# Patient Record
Sex: Male | Born: 1965 | Race: White | Hispanic: No | Smoking: Never smoker
Health system: Southern US, Community
[De-identification: ages and names within clinical notes are randomized; demographics above are authoritative.]

## PROBLEM LIST (undated history)

## (undated) DIAGNOSIS — I10 Essential (primary) hypertension: Secondary | ICD-10-CM

## (undated) DIAGNOSIS — M1990 Unspecified osteoarthritis, unspecified site: Secondary | ICD-10-CM

## (undated) DIAGNOSIS — J329 Chronic sinusitis, unspecified: Secondary | ICD-10-CM

## (undated) DIAGNOSIS — E785 Hyperlipidemia, unspecified: Secondary | ICD-10-CM

## (undated) DIAGNOSIS — E0590 Thyrotoxicosis, unspecified without thyrotoxic crisis or storm: Secondary | ICD-10-CM

## (undated) DIAGNOSIS — L719 Rosacea, unspecified: Secondary | ICD-10-CM

## (undated) DIAGNOSIS — Z91013 Allergy to seafood: Secondary | ICD-10-CM

## (undated) HISTORY — PX: COLONOSCOPY: SHX174

## (undated) HISTORY — DX: Unspecified osteoarthritis, unspecified site: M19.90

## (undated) HISTORY — DX: Thyrotoxicosis, unspecified without thyrotoxic crisis or storm: E05.90

## (undated) HISTORY — DX: Chronic sinusitis, unspecified: J32.9

## (undated) HISTORY — DX: Allergy to seafood: Z91.013

## (undated) HISTORY — PX: OTHER SURGICAL HISTORY: SHX169

## (undated) HISTORY — DX: Hyperlipidemia, unspecified: E78.5

## (undated) HISTORY — DX: Essential (primary) hypertension: I10

## (undated) HISTORY — DX: Rosacea, unspecified: L71.9

---

## 2000-02-13 ENCOUNTER — Encounter: Payer: Self-pay | Admitting: Emergency Medicine

## 2000-02-13 ENCOUNTER — Emergency Department (HOSPITAL_COMMUNITY): Admission: EM | Admit: 2000-02-13 | Discharge: 2000-02-13 | Payer: Self-pay | Admitting: Emergency Medicine

## 2004-12-05 ENCOUNTER — Ambulatory Visit: Payer: Self-pay | Admitting: Pulmonary Disease

## 2005-06-03 ENCOUNTER — Ambulatory Visit: Payer: Self-pay | Admitting: Pulmonary Disease

## 2005-06-18 ENCOUNTER — Ambulatory Visit: Payer: Self-pay | Admitting: Pulmonary Disease

## 2006-04-24 ENCOUNTER — Ambulatory Visit: Payer: Self-pay | Admitting: Pulmonary Disease

## 2006-05-26 ENCOUNTER — Ambulatory Visit: Payer: Self-pay | Admitting: Internal Medicine

## 2007-06-08 ENCOUNTER — Ambulatory Visit: Payer: Self-pay | Admitting: Pulmonary Disease

## 2007-06-08 LAB — CONVERTED CEMR LAB
ALT: 18 units/L (ref 0–53)
AST: 24 units/L (ref 0–37)
Albumin: 4 g/dL (ref 3.5–5.2)
Alkaline Phosphatase: 97 units/L (ref 39–117)
BUN: 14 mg/dL (ref 6–23)
Basophils Absolute: 0 10*3/uL (ref 0.0–0.1)
Basophils Relative: 0.5 % (ref 0.0–1.0)
Bilirubin Urine: NEGATIVE
Bilirubin, Direct: 0.1 mg/dL (ref 0.0–0.3)
CO2: 27 meq/L (ref 19–32)
Calcium: 9.2 mg/dL (ref 8.4–10.5)
Chloride: 112 meq/L (ref 96–112)
Cholesterol: 204 mg/dL (ref 0–200)
Creatinine, Ser: 0.9 mg/dL (ref 0.4–1.5)
Direct LDL: 120 mg/dL
Eosinophils Absolute: 0.5 10*3/uL (ref 0.0–0.6)
Eosinophils Relative: 8.9 % — ABNORMAL HIGH (ref 0.0–5.0)
GFR calc Af Amer: 120 mL/min
GFR calc non Af Amer: 99 mL/min
Glucose, Bld: 111 mg/dL — ABNORMAL HIGH (ref 70–99)
HCT: 43.4 % (ref 39.0–52.0)
HDL: 69.7 mg/dL (ref >39.0)
Hemoglobin, Urine: NEGATIVE
Hemoglobin: 15.1 g/dL (ref 13.0–17.0)
Ketones, ur: NEGATIVE mg/dL
Leukocytes, UA: NEGATIVE
Lymphocytes Relative: 29.4 % (ref 12.0–46.0)
MCHC: 34.9 g/dL (ref 30.0–36.0)
MCV: 98 fL (ref 78.0–100.0)
Monocytes Absolute: 0.5 10*3/uL (ref 0.2–0.7)
Monocytes Relative: 8.5 % (ref 3.0–11.0)
Neutro Abs: 3.3 10*3/uL (ref 1.4–7.7)
Neutrophils Relative %: 52.7 % (ref 43.0–77.0)
Nitrite: NEGATIVE
Platelets: 322 10*3/uL (ref 150–400)
Potassium: 4.4 meq/L (ref 3.5–5.1)
RBC: 4.43 M/uL (ref 4.22–5.81)
RDW: 11.6 % (ref 11.5–14.6)
Sodium: 140 meq/L (ref 135–145)
Specific Gravity, Urine: 1.015 (ref 1.000–1.03)
TSH: 0.02 microintl units/mL — ABNORMAL LOW (ref 0.35–5.50)
Total Bilirubin: 0.8 mg/dL (ref 0.3–1.2)
Total CHOL/HDL Ratio: 2.9
Total Protein, Urine: NEGATIVE mg/dL
Total Protein: 7.3 g/dL (ref 6.0–8.3)
Triglycerides: 71 mg/dL (ref 0–149)
Urine Glucose: NEGATIVE mg/dL
Urobilinogen, UA: 0.2 (ref 0.0–1.0)
VLDL: 14 mg/dL (ref 0–40)
WBC: 6.1 10*3/uL (ref 4.5–10.5)
pH: 6 (ref 5.0–8.0)

## 2007-06-29 ENCOUNTER — Ambulatory Visit: Payer: Self-pay | Admitting: Pulmonary Disease

## 2007-06-29 LAB — CONVERTED CEMR LAB
Free T4: 0.7 ng/dL (ref 0.6–1.6)
T3, Free: 2.7 pg/mL (ref 2.3–4.2)
TSH: 0.98 microintl units/mL (ref 0.35–5.50)

## 2007-12-17 DIAGNOSIS — E785 Hyperlipidemia, unspecified: Secondary | ICD-10-CM | POA: Insufficient documentation

## 2007-12-18 ENCOUNTER — Ambulatory Visit: Payer: Self-pay | Admitting: Pulmonary Disease

## 2007-12-18 DIAGNOSIS — F1010 Alcohol abuse, uncomplicated: Secondary | ICD-10-CM | POA: Insufficient documentation

## 2007-12-18 DIAGNOSIS — Z91013 Allergy to seafood: Secondary | ICD-10-CM | POA: Insufficient documentation

## 2007-12-18 DIAGNOSIS — K649 Unspecified hemorrhoids: Secondary | ICD-10-CM | POA: Insufficient documentation

## 2007-12-18 DIAGNOSIS — J329 Chronic sinusitis, unspecified: Secondary | ICD-10-CM | POA: Insufficient documentation

## 2007-12-23 ENCOUNTER — Encounter: Payer: Self-pay | Admitting: Pulmonary Disease

## 2008-01-12 ENCOUNTER — Ambulatory Visit (HOSPITAL_BASED_OUTPATIENT_CLINIC_OR_DEPARTMENT_OTHER): Admission: RE | Admit: 2008-01-12 | Discharge: 2008-01-12 | Payer: Self-pay | Admitting: Orthopedic Surgery

## 2008-01-12 ENCOUNTER — Encounter (INDEPENDENT_AMBULATORY_CARE_PROVIDER_SITE_OTHER): Payer: Self-pay | Admitting: Orthopedic Surgery

## 2008-01-25 ENCOUNTER — Encounter: Payer: Self-pay | Admitting: Pulmonary Disease

## 2008-07-24 IMAGING — CR DG CHEST 2V
2 series · 2 of 2 positions shown · non-contrast
Comparison: 06/03/05.

CLINICAL DATA: Physical.  
 CHEST - 2 VIEW:

[view not recorded (1 of 2)]
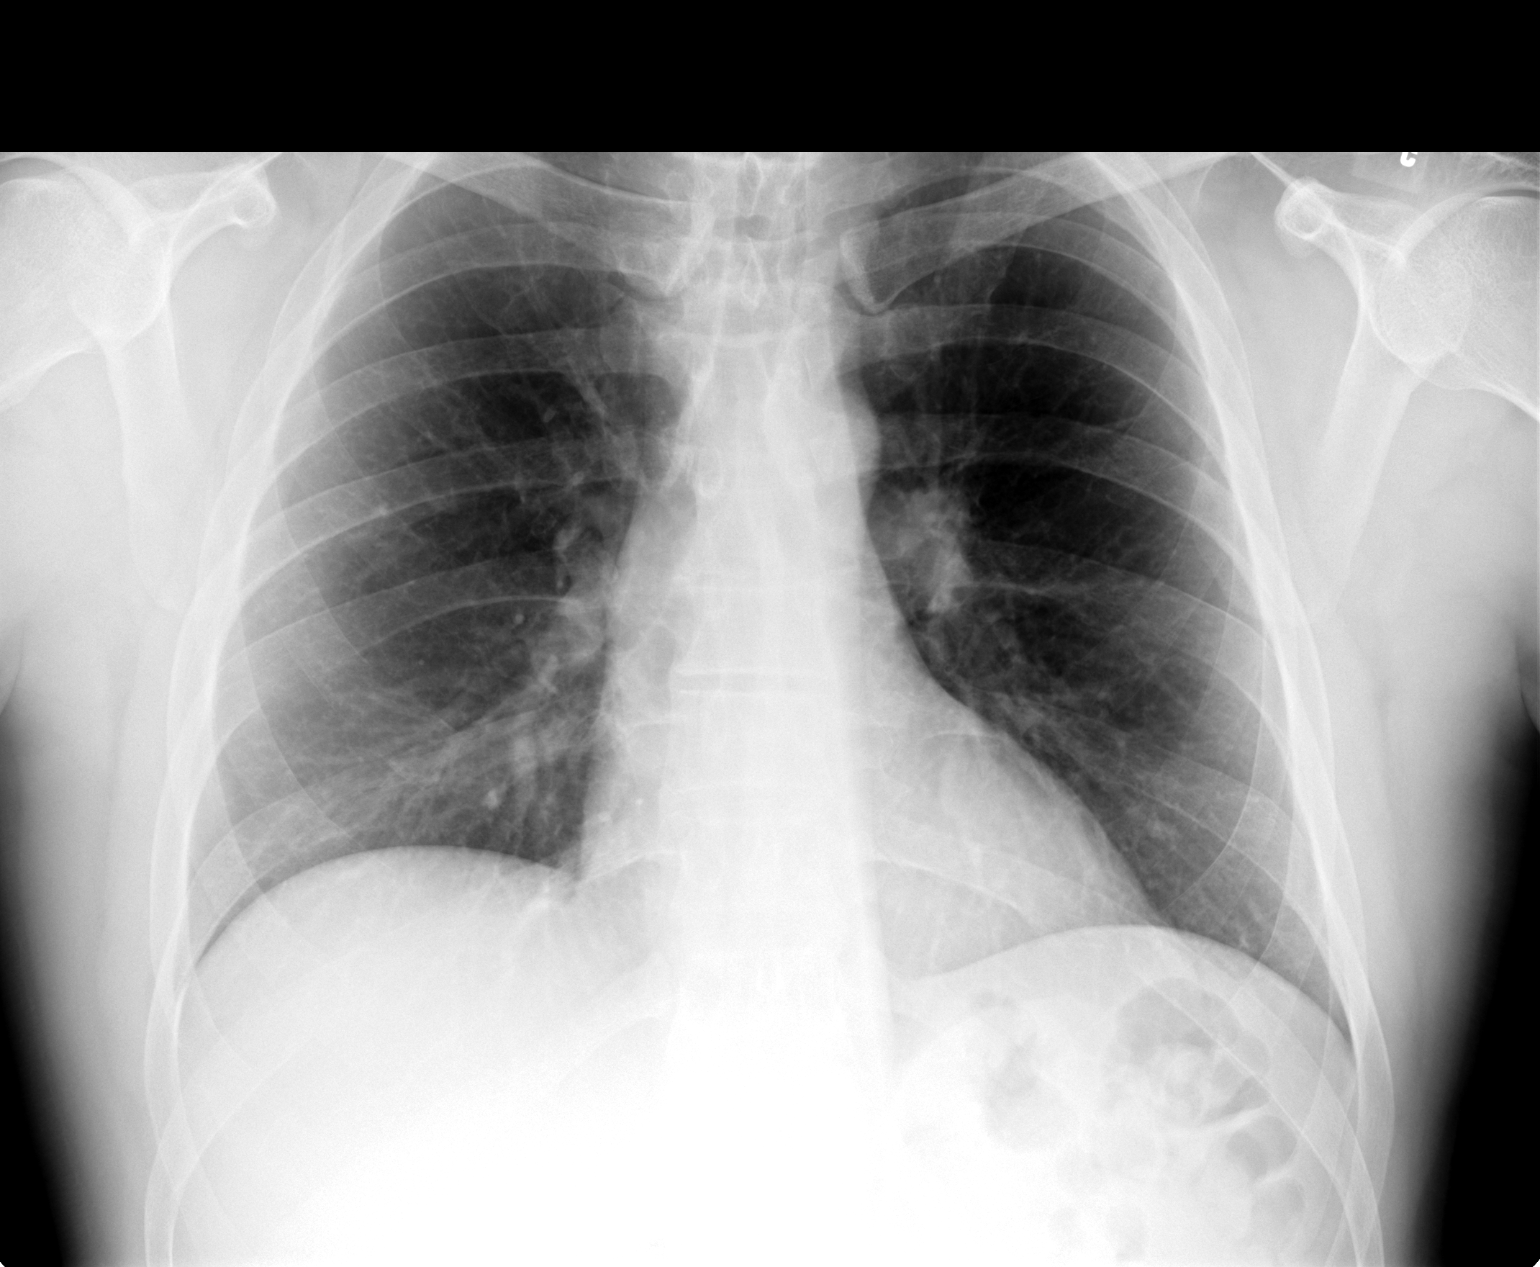

[view not recorded (2 of 2)]
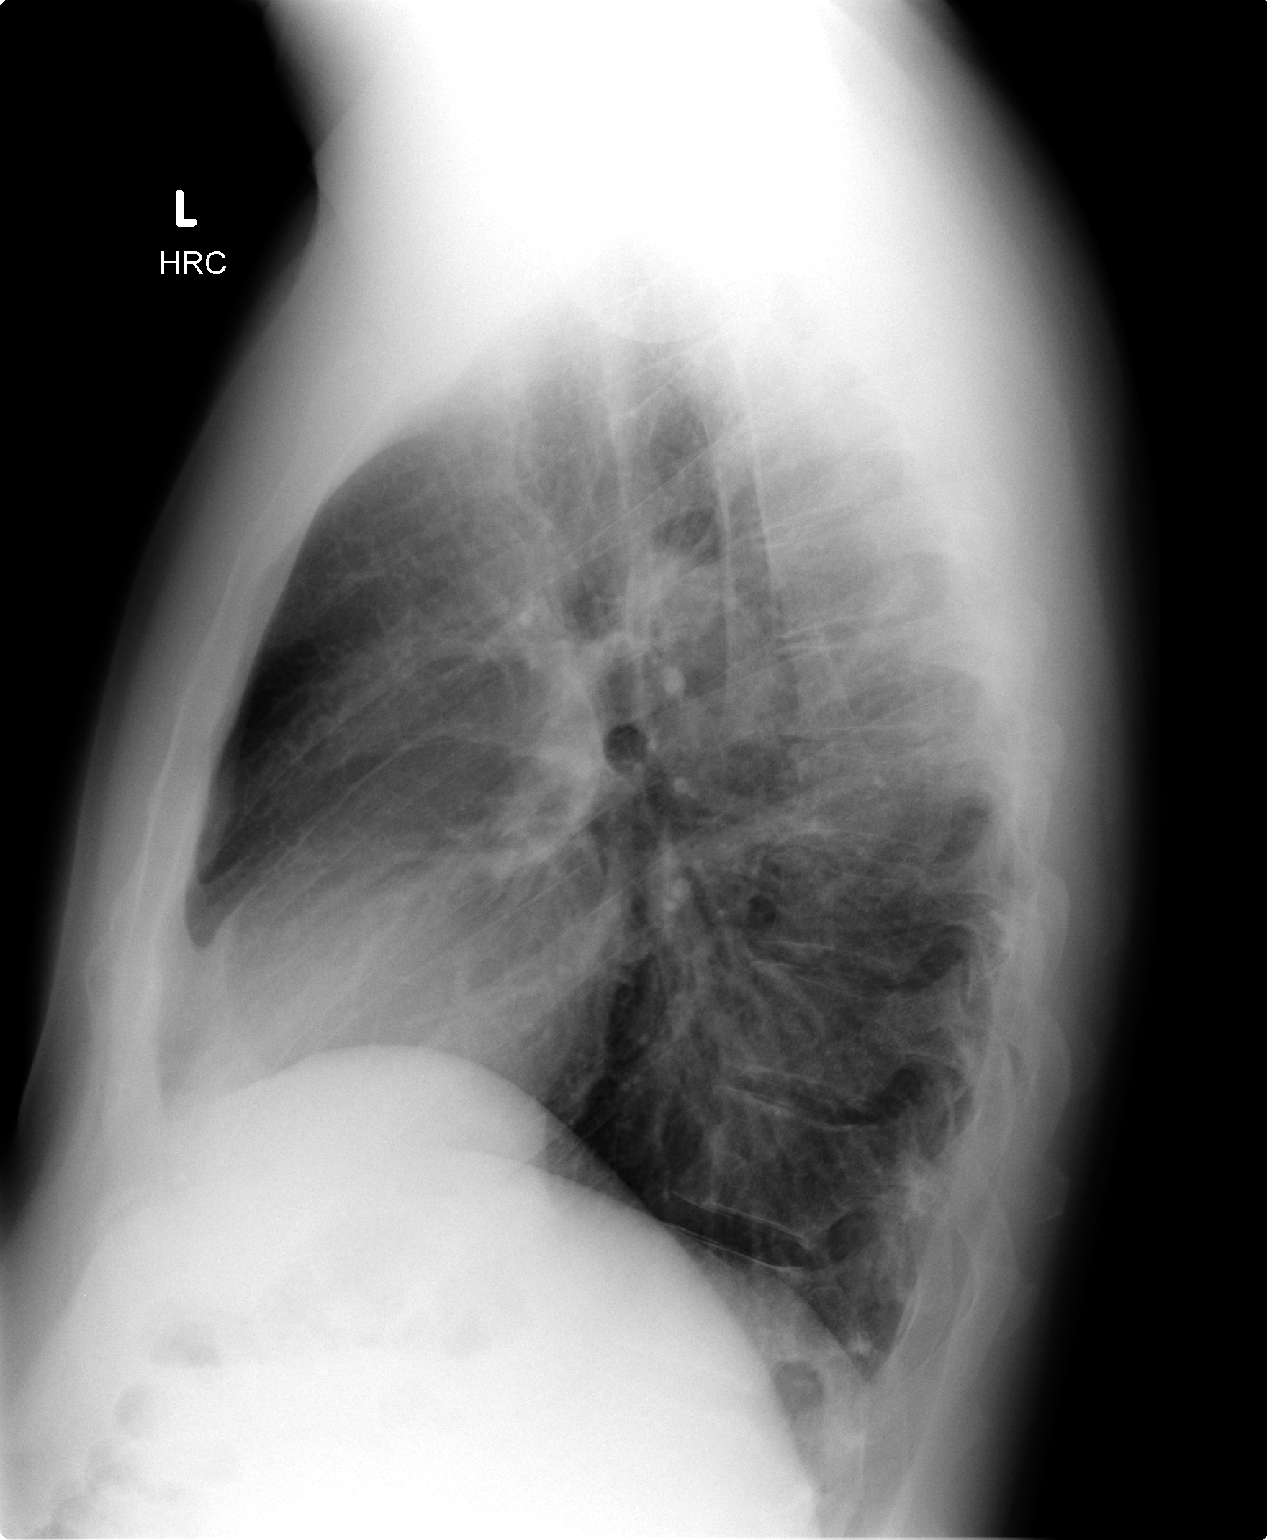

[2 of 2 positions shown; findings below may reference images not displayed]

FINDINGS: Trachea is midline.  Heart size stable.  Lungs are clear.  No pleural fluid.
IMPRESSION: No acute findings.

## 2008-10-31 ENCOUNTER — Ambulatory Visit: Payer: Self-pay | Admitting: Pulmonary Disease

## 2008-10-31 DIAGNOSIS — M1990 Unspecified osteoarthritis, unspecified site: Secondary | ICD-10-CM | POA: Insufficient documentation

## 2008-11-06 LAB — CONVERTED CEMR LAB
ALT: 17 units/L (ref 0–53)
AST: 21 units/L (ref 0–37)
Albumin: 4.1 g/dL (ref 3.5–5.2)
Alkaline Phosphatase: 104 units/L (ref 39–117)
BUN: 15 mg/dL (ref 6–23)
Basophils Absolute: 0.1 10*3/uL (ref 0.0–0.1)
Basophils Relative: 0.8 % (ref 0.0–3.0)
Bilirubin, Direct: 0.1 mg/dL (ref 0.0–0.3)
CO2: 27 meq/L (ref 19–32)
Calcium: 9.5 mg/dL (ref 8.4–10.5)
Chloride: 107 meq/L (ref 96–112)
Cholesterol: 197 mg/dL (ref 0–200)
Creatinine, Ser: 0.9 mg/dL (ref 0.4–1.5)
Eosinophils Absolute: 0.1 10*3/uL (ref 0.0–0.7)
Eosinophils Relative: 1.9 % (ref 0.0–5.0)
Free T4: 0.9 ng/dL (ref 0.6–1.6)
GFR calc Af Amer: 118 mL/min
GFR calc non Af Amer: 98 mL/min
Glucose, Bld: 101 mg/dL — ABNORMAL HIGH (ref 70–99)
HCT: 44 % (ref 39.0–52.0)
HDL: 72 mg/dL (ref >39.0)
Hemoglobin: 15.4 g/dL (ref 13.0–17.0)
LDL Cholesterol: 119 mg/dL — ABNORMAL HIGH (ref 0–99)
Lymphocytes Relative: 27.6 % (ref 12.0–46.0)
MCHC: 35.1 g/dL (ref 30.0–36.0)
MCV: 99.7 fL (ref 78.0–100.0)
Monocytes Absolute: 0.6 10*3/uL (ref 0.1–1.0)
Monocytes Relative: 8.5 % (ref 3.0–12.0)
Neutro Abs: 3.9 10*3/uL (ref 1.4–7.7)
Neutrophils Relative %: 61.2 % (ref 43.0–77.0)
Platelets: 360 10*3/uL (ref 150–400)
Potassium: 5 meq/L (ref 3.5–5.1)
RBC: 4.41 M/uL (ref 4.22–5.81)
RDW: 11.7 % (ref 11.5–14.6)
Sodium: 140 meq/L (ref 135–145)
TSH: 0.68 microintl units/mL (ref 0.35–5.50)
Total Bilirubin: 0.7 mg/dL (ref 0.3–1.2)
Total CHOL/HDL Ratio: 2.7
Total Protein: 7.8 g/dL (ref 6.0–8.3)
Triglycerides: 31 mg/dL (ref 0–149)
VLDL: 6 mg/dL (ref 0–40)
WBC: 6.5 10*3/uL (ref 4.5–10.5)

## 2010-01-09 ENCOUNTER — Telehealth (INDEPENDENT_AMBULATORY_CARE_PROVIDER_SITE_OTHER): Payer: Self-pay | Admitting: *Deleted

## 2010-01-10 ENCOUNTER — Ambulatory Visit: Payer: Self-pay | Admitting: Pulmonary Disease

## 2010-01-15 ENCOUNTER — Ambulatory Visit: Payer: Self-pay | Admitting: Pulmonary Disease

## 2010-01-16 LAB — CONVERTED CEMR LAB
ALT: 16 units/L (ref 0–53)
AST: 18 units/L (ref 0–37)
Albumin: 4.2 g/dL (ref 3.5–5.2)
Alkaline Phosphatase: 92 units/L (ref 39–117)
BUN: 20 mg/dL (ref 6–23)
Basophils Absolute: 0 10*3/uL (ref 0.0–0.1)
Basophils Relative: 0.6 % (ref 0.0–3.0)
Bilirubin, Direct: 0.2 mg/dL (ref 0.0–0.3)
CO2: 27 meq/L (ref 19–32)
Calcium: 9.5 mg/dL (ref 8.4–10.5)
Chloride: 102 meq/L (ref 96–112)
Cholesterol: 192 mg/dL (ref 0–200)
Creatinine, Ser: 1 mg/dL (ref 0.4–1.5)
Eosinophils Absolute: 0.2 10*3/uL (ref 0.0–0.7)
Eosinophils Relative: 4.5 % (ref 0.0–5.0)
GFR calc non Af Amer: 86.2 mL/min (ref >60)
Glucose, Bld: 95 mg/dL (ref 70–99)
HCT: 44.5 % (ref 39.0–52.0)
HDL: 83.5 mg/dL (ref >39.00)
Hemoglobin: 14.6 g/dL (ref 13.0–17.0)
LDL Cholesterol: 100 mg/dL — ABNORMAL HIGH (ref 0–99)
Lymphocytes Relative: 37.9 % (ref 12.0–46.0)
Lymphs Abs: 2 10*3/uL (ref 0.7–4.0)
MCHC: 32.9 g/dL (ref 30.0–36.0)
MCV: 103.4 fL — ABNORMAL HIGH (ref 78.0–100.0)
Monocytes Absolute: 0.5 10*3/uL (ref 0.1–1.0)
Monocytes Relative: 10.1 % (ref 3.0–12.0)
Neutro Abs: 2.5 10*3/uL (ref 1.4–7.7)
Neutrophils Relative %: 46.9 % (ref 43.0–77.0)
Platelets: 348 10*3/uL (ref 150.0–400.0)
Potassium: 4.7 meq/L (ref 3.5–5.1)
RBC: 4.3 M/uL (ref 4.22–5.81)
RDW: 11.9 % (ref 11.5–14.6)
Sodium: 137 meq/L (ref 135–145)
TSH: 0.73 microintl units/mL (ref 0.35–5.50)
Total Bilirubin: 0.8 mg/dL (ref 0.3–1.2)
Total CHOL/HDL Ratio: 2
Total Protein: 7.9 g/dL (ref 6.0–8.3)
Triglycerides: 42 mg/dL (ref 0.0–149.0)
VLDL: 8.4 mg/dL (ref 0.0–40.0)
WBC: 5.2 10*3/uL (ref 4.5–10.5)

## 2011-01-01 NOTE — Assessment & Plan Note (Signed)
Summary: cpx 1 yr ///kp   CC:  15 month ROV & CPX....  History of Present Illness: 45 y/o WM here for a follow up visit & CPX... he reports a good year and is withou new complaints or concerns today... he has hypercholesterolemia controlled w/ Lescol XL80 but his co-pay has increased to ?3rd tier & we discussed change to poss 1st tier drug (ie- Prav40, he was prev intol to Lipitor & Zocor)...    Current Problems:   Hx of SINUSITIS (ICD-473.9) - uses OTC antihist/ decongestant as needed... no recent infections or problems.  HYPERLIPIDEMIA (ICD-272.4) - on LESCOL XL 80 and tolerates this well... prev intol to Lipitor & Zocor... his HDL is quite high>>>  ~  FLP 7/08 on Lesc80 showed TChol 204, TG 71, HDL 70, LDL 120  ~  FLP 11/09 on Lesc80 showed TChol 197, TG 31, HDL 72, LDL 119  ~  FLP 2/11 on Lesc80 showed TChol 192, TG 42, HDL 84, LDL 100... rec> change to Prav40$$.  ? of HYPERTHYROIDISM, SUBCLINICAL (ICD-242.90) - labs done 7/08 showed TSH= 0.02 (lab error?), and repeat studies showed TSH= 0.98 w/ norm FreeT3 & FreeT4... gland feels normal without goiter or nodules palpated...  ~  labs 11/09 showed TSH= 0.68  ~  labs 2/11 showed TSH= 0.73  Hx of HEMORRHOIDS (ICD-455.6) - no prev colonoscopy etc.. no recent problems.  BEER DRINKER (ICD-305.00) - Hx 12/wk, normal LFT's, no problems...  DEGENERATIVE JOINT DISEASE (ICD-715.90) - he takes ALEVE OTC Prn... hx LBP w/ eval & Rx by DrPCarter in the 1990s (?HNP & Rx w/ exercises- improved, no surg)...  PERSONAL HISTORY OF ALLERGY TO SEAFOOD (ICD-V15.04) - Urticaria 5/07 believed from seafood... no recurrence since then...  Health Maintenance - he hasn't had Flu shots, and doesn't want them... no GU or GI problems...   Allergies: 1)  ! Lipitor (Atorvastatin Calcium) 2)  ! Zocor (Simvastatin) 3)  ! * Seafood  Comments:  Nurse/Medical Assistant: The patient's medications and allergies were reviewed with the patient and were updated in  the Medication and Allergy Lists.  Past History:  Past Medical History:  Hx of SINUSITIS (ICD-473.9) HYPERLIPIDEMIA (ICD-272.4) ? of HYPERTHYROIDISM, SUBCLINICAL (ICD-242.90) Hx of HEMORRHOIDS (ICD-455.6) BEER DRINKER (ICD-305.00) DEGENERATIVE JOINT DISEASE (ICD-715.90) PERSONAL HISTORY OF ALLERGY TO SEAFOOD (ICD-V15.04)  Past Surgical History: S/P Bilat Inguinal Hernia Repairs - Left in 1997, Right in 2003  Family History: Reviewed history from 10/31/2008 and no changes required. Mother died age 20 (in 47) w/ brain tumor  Father alive age 81 w/  hx of heart disease and DM 1 sister alive age 56  Social History: Reviewed history from 10/31/2008 and no changes required. 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  Review of Systems  The patient denies fever, chills, sweats, anorexia, fatigue, weakness, malaise, weight loss, sleep disorder, blurring, diplopia, eye irritation, eye discharge, vision loss, eye pain, photophobia, earache, ear discharge, tinnitus, decreased hearing, nasal congestion, nosebleeds, sore throat, hoarseness, chest pain, palpitations, syncope, dyspnea on exertion, orthopnea, PND, peripheral edema, cough, dyspnea at rest, excessive sputum, hemoptysis, wheezing, pleurisy, nausea, vomiting, diarrhea, constipation, change in bowel habits, abdominal pain, melena, hematochezia, jaundice, gas/bloating, indigestion/heartburn, dysphagia, odynophagia, dysuria, hematuria, urinary frequency, urinary hesitancy, nocturia, incontinence, back pain, joint pain, joint swelling, muscle cramps, muscle weakness, stiffness, arthritis, sciatica, restless legs, leg pain at night, leg pain with exertion, rash, itching, dryness, suspicious lesions, paralysis, paresthesias, seizures, tremors, vertigo, transient blindness, frequent  falls, frequent headaches, difficulty walking, depression, anxiety, memory  loss, confusion, cold intolerance, heat intolerance, polydipsia, polyphagia, polyuria, unusual weight change, abnormal bruising, bleeding, enlarged lymph nodes, urticaria, allergic rash, hay fever, and recurrent infections.    Vital Signs:  Patient profile:   45 year old male Height:      74.5 inches Weight:      226 pounds BMI:     28.73 O2 Sat:      97 % on Room air Temp:     99.0 degrees F oral Pulse rate:   66 / minute BP sitting:   142 / 80  (right arm) Cuff size:   regular  Vitals Entered By: Randell Loop CMA (January 15, 2010 10:28 AM)  O2 Sat at Rest %:  97 O2 Flow:  Room air CC: 15 month ROV & CPX... Is Patient Diabetic? No Pain Assessment Patient in pain? no      Comments NO CHANGES IN MEDS TODAY   Physical Exam  Additional Exam:  WD, WN, 45 y/o WM in NAD... GENERAL:  Alert & oriented; pleasant & cooperative. HEENT:  [place]/AT, EOM-wnl, PERRLA, Fundi-benign, EACs-clear, TMs-wnl, NOSE-clear, THROAT-clear & wnl. NECK:  Supple w/ full ROM; no JVD; normal carotid impulses w/o bruits; no thyromegaly or nodules palpated; no lymphadenopathy. CHEST:  Clear to P & A; without wheezes/ rales/ or rhonchi. HEART:  Regular Rhythm; without murmurs/ rubs/ or gallops. ABDOMEN:  Soft & nontender; normal bowel sounds; no organomegaly or masses detected. RECTAL:  Neg - prostate 2+ & nontender w/o nodules; stool hematest neg. EXT: without deformities or arthritic changes; no varicose veins/ venous insuffic/ or edema. NEURO:  CN's intact; motor testing normal; sensory testing normal; gait normal & balance OK. DERM:  No lesions noted; ?mild acne rosacea...    CXR  Procedure date:  01/15/2010  Findings:      CHEST - 2 VIEW Comparison: 10/31/2008   Findings: Heart size is normal.  The mediastinum is unremarkable. Lungs are clear.  No effusions.  Ordinary mild degenerative changes are noted in the thoracic region.   IMPRESSION: No change.  No active disease.   Read By:  Thomasenia Sales,  M.D.   EKG  Procedure date:  01/15/2010  Findings:      Normal sinus rhythm with rate of:  60/min... Tracing is WNL, NAD...  SN   MISC. Report  Procedure date:  01/10/2010  Findings:      BMP (METABOL)   Sodium                    137 mEq/L                   135-145   Potassium                 4.7 mEq/L                   3.5-5.1   Chloride                  102 mEq/L                   96-112   Carbon Dioxide            27 mEq/L                    19-32   Glucose  95 mg/dL                    16-10   BUN                       20 mg/dL                    9-60   Creatinine                1.0 mg/dL                   4.5-4.0   Calcium                   9.5 mg/dL                   9.8-11.9   GFR                       86.20 mL/min                >60  Lipid Panel (LIPID)   Cholesterol               192 mg/dL                   1-478   Triglycerides             42.0 mg/dL                  2.9-562.1   HDL                       30.86 mg/dL                 >57.84   LDL Cholesterol      [H]  696 mg/dL                   2-95   CBC Platelet w/Diff (CBCD)   White Cell Count          5.2 K/uL                    4.5-10.5   Red Cell Count            4.30 Mil/uL                 4.22-5.81   Hemoglobin                14.6 g/dL                   28.4-13.2   Hematocrit                44.5 %                      39.0-52.0   MCV                  [H]  103.4 fl                    78.0-100.0   Platelet Count            348.0 K/uL                  150.0-400.0   Neutrophil %  46.9 %                      43.0-77.0   Lymphocyte %              37.9 %                      12.0-46.0   Monocyte %                10.1 %                      3.0-12.0   Eosinophils%              4.5 %                       0.0-5.0   Basophils %               0.6 %    Comments:      Hepatic/Liver Function Panel (HEPATIC)   Total Bilirubin           0.8 mg/dL                   4.4-0.3    Direct Bilirubin          0.2 mg/dL                   4.7-4.2   Alkaline Phosphatase      92 U/L                      39-117   AST                       18 U/L                      0-37   ALT                       16 U/L                      0-53   Total Protein             7.9 g/dL                    5.9-5.6   Albumin                   4.2 g/dL                    3.8-7.5  Tests: (5) TSH (TSH)   FastTSH                   0.73 uIU/mL                 0.35-5.50   Impression & Recommendations:  Problem # 1:  PHYSICAL EXAMINATION (ICD-V70.0)  Orders: EKG w/ Interpretation (93000) T-2 View CXR (71020TC)  Problem # 2:  HYPERLIPIDEMIA (ICD-272.4) His numbers are good on the Lescol XL 80 but it's up to a 3rd tier co-pay on his insurance... try switch to PRAVASTATIN 40mg /d. His updated medication list for this problem includes:    Pravastatin Sodium 40 Mg Tabs (Pravastatin sodium) .Marland Kitchen... Take 1 tab by mouth at bedtime...  Problem # 3:  DEGENERATIVE JOINT DISEASE (ICD-715.90) Stable  w/ OTC aleve & tol well... His updated medication list for this problem includes:    Aleve 220 Mg Tabs (Naproxen sodium) .Marland Kitchen... Take 1 tablet by mouth once a day  Problem # 4:  OTHER MEDICAL ISSUES AS NOTED>>>   Complete Medication List: 1)  Pravastatin Sodium 40 Mg Tabs (Pravastatin sodium) .... Take 1 tab by mouth at bedtime.Marland KitchenMarland Kitchen 2)  Aleve 220 Mg Tabs (Naproxen sodium) .... Take 1 tablet by mouth once a day  Other Orders: Prescription Created Electronically [phone])  Patient Instructions: 1)  Today we decided to change the Lescol to PRAVASTATIN 40mg - avail on the [facility]/ [facility] $4 list...  if you tolerate this med well (& you should) then call us 4-6 months down the road for a follow up cholesterol check, and let me know if you need a 90d supply... 2)  Call for any problems.Marland KitchenMarland Kitchen 3)  Please schedule a follow-up appointment in 1 year. Prescriptions: PRAVASTATIN SODIUM 40 MG TABS (PRAVASTATIN SODIUM) take 1 tab by  mouth at bedtime...  #30 x prn   Entered and Authorized by:   Michele Mcalpine MD   Signed by:   Michele Mcalpine MD on 01/15/2010   Method used:   Print then Give to Patient   RxID:   [id number]    CardioPerfect ECG  ID: [id number] Patient: Daniel Mercer DOB: 05/20/1966 Age: 46 Years Old Sex: Male Race: White Physician: Adiya Selmer Technician: Randell Loop CMA Height: 74.5 Weight: 226 Status: Unconfirmed Past Medical History:  Hx of SINUSITIS (ICD-473.9) HYPERLIPIDEMIA (ICD-272.4) ? of HYPERTHYROIDISM, SUBCLINICAL (ICD-242.90) Hx of HEMORRHOIDS (ICD-455.6) BEER DRINKER (ICD-305.00) DEGENERATIVE JOINT DISEASE (ICD-715.90) PERSONAL HISTORY OF ALLERGY TO SEAFOOD (ICD-V15.04)   Recorded: 01/15/2010 10:35 AM P/PR: 102 ms / 133 ms - Heart rate (maximum exercise) QRS: 99 QT/QTc/QTd: 399 ms / 401 ms / 27 ms - Heart rate (maximum exercise)  P/QRS/T axis: 0 deg / 35 deg / 20 deg - Heart rate (maximum exercise)  Heartrate: 61 bpm  Interpretation:  Normal sinus rhythm with rate of:  60/min... Tracing is WNL, NAD...  SN

## 2011-01-01 NOTE — Progress Notes (Signed)
Summary: labs  Phone Note Call from Patient Call back at [phone]   Caller: Spouse-SArah Call For: nadel Reason for Call: Talk to Nurse Summary of Call: pt wants to come in Wed AM to have labs drawn for appt w/SN next week, Initial call taken by: Eugene Gavia,  January 09, 2010 9:12 AM  Follow-up for Phone Call        pt is scheduled to see SN on Monday 01-15-2010 at 10:30am.  Pt would like to come in this Weds for labs.  Please advise if ok or not.  If ok, please advise what labs and diag codes.  Thanks Arman Filter LPN  January 09, 2010 9:26 AM   Additional Follow-up for Phone Call Additional follow up Details #1::        per SN---ok for labs---lip-bmp-hepat-cbcd-tsh- v70.0  thanks Randell Loop CMA  January 09, 2010 2:45 PM     Additional Follow-up for Phone Call Additional follow up Details #2::    Labs were placed in IDX. [id number]. Vernie Murders  January 09, 2010 2:55 PM   called, spoke with pt's wife.  Per pt's wife, they are aware of this and pt has already had labs drawn this am.  Follow-up by: Gweneth Dimitri RN,  January 10, 2010 10:24 AM

## 2011-01-02 ENCOUNTER — Telehealth: Payer: Self-pay | Admitting: Pulmonary Disease

## 2011-01-09 NOTE — Progress Notes (Signed)
Summary: labs prior to appt  Phone Note Call from Patient   Caller: Spouse Call For: Daniel Mercer Summary of Call: Patients wife phoned to schedule patients annual with Dr Kriste Basque he is shceduled for 01/21/11 at 4 and wants to know if the orders for his fasting labs can be entered for 2/17 because he wants to have them early morning prior to appt They can be reached at [phone] Initial call taken by: Vedia Coffer,  January 02, 2011 9:38 AM  Follow-up for Phone Call        please advise on what labs to order for pt physical on 01-21-11. Thanks. Carron Curie CMA  January 02, 2011 10:18 AM     lab orders have been placed in computer per pts request for 2-17.  lmomtcb for pt to make them aware Randell Loop CMA  January 02, 2011 1:41 PM   pt aware. Carron Curie CMA  January 02, 2011 3:12 PM

## 2011-01-18 ENCOUNTER — Other Ambulatory Visit: Payer: Self-pay | Admitting: Pulmonary Disease

## 2011-01-18 ENCOUNTER — Encounter (INDEPENDENT_AMBULATORY_CARE_PROVIDER_SITE_OTHER): Payer: Self-pay | Admitting: *Deleted

## 2011-01-18 ENCOUNTER — Other Ambulatory Visit: Payer: Managed Care, Other (non HMO)

## 2011-01-18 DIAGNOSIS — E0590 Thyrotoxicosis, unspecified without thyrotoxic crisis or storm: Secondary | ICD-10-CM

## 2011-01-18 DIAGNOSIS — Z0000 Encounter for general adult medical examination without abnormal findings: Secondary | ICD-10-CM

## 2011-01-18 LAB — HEPATIC FUNCTION PANEL
ALT: 17 U/L (ref 0–53)
AST: 21 U/L (ref 0–37)
Albumin: 4.1 g/dL (ref 3.5–5.2)
Alkaline Phosphatase: 99 U/L (ref 39–117)
Bilirubin, Direct: 0.1 mg/dL (ref 0.0–0.3)
Total Bilirubin: 0.7 mg/dL (ref 0.3–1.2)
Total Protein: 7.2 g/dL (ref 6.0–8.3)

## 2011-01-18 LAB — LIPID PANEL
Cholesterol: 192 mg/dL (ref 0–200)
HDL: 80 mg/dL (ref >39.00)
LDL Cholesterol: 100 mg/dL — ABNORMAL HIGH (ref 0–99)
Total CHOL/HDL Ratio: 2
Triglycerides: 60 mg/dL (ref 0.0–149.0)
VLDL: 12 mg/dL (ref 0.0–40.0)

## 2011-01-18 LAB — CBC WITH DIFFERENTIAL/PLATELET
Basophils Absolute: 0 10*3/uL (ref 0.0–0.1)
Eosinophils Relative: 4.1 % (ref 0.0–5.0)
HCT: 45.4 % (ref 39.0–52.0)
Hemoglobin: 15.7 g/dL (ref 13.0–17.0)
Lymphocytes Relative: 31.2 % (ref 12.0–46.0)
Lymphs Abs: 1.7 10*3/uL (ref 0.7–4.0)
Monocytes Relative: 9.1 % (ref 3.0–12.0)
Platelets: 322 10*3/uL (ref 150.0–400.0)
WBC: 5.5 10*3/uL (ref 4.5–10.5)

## 2011-01-18 LAB — T3, FREE: T3, Free: 3.8 pg/mL (ref 2.3–4.2)

## 2011-01-18 LAB — BASIC METABOLIC PANEL
CO2: 29 mEq/L (ref 19–32)
Calcium: 9.3 mg/dL (ref 8.4–10.5)
Chloride: 103 mEq/L (ref 96–112)
Glucose, Bld: 100 mg/dL — ABNORMAL HIGH (ref 70–99)
Sodium: 137 mEq/L (ref 135–145)

## 2011-01-18 LAB — T4, FREE: Free T4: 0.88 ng/dL (ref 0.60–1.60)

## 2011-01-21 ENCOUNTER — Encounter: Payer: Self-pay | Admitting: Pulmonary Disease

## 2011-01-21 ENCOUNTER — Encounter (INDEPENDENT_AMBULATORY_CARE_PROVIDER_SITE_OTHER): Payer: Managed Care, Other (non HMO) | Admitting: Pulmonary Disease

## 2011-01-21 DIAGNOSIS — Z0000 Encounter for general adult medical examination without abnormal findings: Secondary | ICD-10-CM

## 2011-01-21 DIAGNOSIS — L719 Rosacea, unspecified: Secondary | ICD-10-CM | POA: Insufficient documentation

## 2011-01-29 NOTE — Assessment & Plan Note (Signed)
Summary: CPX//SH   CC:  Yearly ROV & CPX....  History of Present Illness: 45 y/o WM here for a follow up visit & CPX...    ~  [id number]:  he reports a good year and is without new complaints or concerns today... he has hypercholesterolemia controlled w/ Lescol XL80 but his co-pay has increased to ?3rd tier & we discussed change to poss 1st tier drug (ie- Prav40, he was prev intol to Lipitor & Zocor).   ~  January 21, 2011:  Daniel Mercer reports another good year overall- recently dx w/ Rosacea by DrGould, Derm & started on Metrogel & he is hopeful it will work for him...    Last yr we switched his Lescol to PRAVASTATIN 40mg /d & f/u FLP looks great on this med;  weight is down a few lbs to 219# & we discussed goal wt  ~200# in his 6'3" frame...    Fasting labs are WNL, EKG shows NSR & NAD, he's a non-smoker & asymptomatic> CXR 2/11 clear...    Current Problems:   Hx of SINUSITIS (ICD-473.9) - uses OTC antihist/ decongestant as needed... no recent infections or problems.  HYPERLIPIDEMIA (ICD-272.4) - on PRAVASTATIN 40mg /d (prev intol to Lipitor & Zocor)... his HDL is quite high>>>  ~  FLP 7/08 on Lesc80 showed TChol 204, TG 71, HDL 70, LDL 120  ~  FLP 11/09 on Lesc80 showed TChol 197, TG 31, HDL 72, LDL 119  ~  FLP 2/11 on Lesc80 showed TChol 192, TG 42, HDL 84, LDL 100... rec> change to Prav40$$.  ~  FLP 2/12 on Prav40 showed TChol 192, TG 60, HDL 80, LDL 100  ? of HYPERTHYROIDISM, SUBCLINICAL (ICD-242.90) - labs done 7/08 showed TSH= 0.02 (lab error?), and repeat studies showed TSH= 0.98 w/ norm FreeT3 & FreeT4... gland feels normal without goiter or nodules palpated...  ~  labs 11/09 showed TSH= 0.68  ~  labs 2/11 showed TSH= 0.73  ~  labs 2/12 showed TSH= 0.58, FreeT3= 3.8 (2.3-4.2), FreeT4= 0.88 (0.60-1.60)  Hx of HEMORRHOIDS (ICD-455.6) - no prev colonoscopy etc.. no recent problems.  ~  2/12:  rectal exam= neg, 2+ norm prostate, stool heme neg.  BEER DRINKER (ICD-305.00) - Hx 12/wk,  normal LFT's, no problems...  DEGENERATIVE JOINT DISEASE (ICD-715.90) - he takes ALEVE OTC Prn... hx LBP w/ eval & Rx by DrPCarter in the 1990s (?HNP & Rx w/ exercises- improved, no surg)...  PERSONAL HISTORY OF ALLERGY TO SEAFOOD (ICD-V15.04) - Urticaria 5/07 believed from seafood... he has experimented some since then & says "it's oysters"...  Health Maintenance  ~  GI:  neg fam hx of colon cancer or polyps, rectal exam is OK & stool is heme neg.  ~  GU:  he is asymptomatic, prostate is 2+ normal, no nodules etc.  ~  Immunizations:  he hasn't had Flu shots, and doesn't want them... no indication for pre-age65 pneumovax... TDAP given 2/12...   Preventive Screening-Counseling & Management  Alcohol-Tobacco     Smoking Status: never  Allergies: 1)  ! Lipitor (Atorvastatin Calcium) 2)  ! Zocor (Simvastatin) 3)  ! * Seafood  Comments:  Nurse/Medical Assistant: The patient's medications and allergies were reviewed with the patient and were updated in the Medication and Allergy Lists.  Past History:  Past Medical History: Hx of SINUSITIS (ICD-473.9) HYPERLIPIDEMIA (ICD-272.4) ? 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 Mother died age 30 (in 71) w/ brain tumor  Father alive age 5 w/  hx of heart disease and DM 1 sister alive age 25  Social History: Reviewed history from 01/15/2010 and no changes required. 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  The patient  denies fever, chills, sweats, anorexia, fatigue, weakness, malaise, weight loss, sleep disorder, blurring, diplopia, eye irritation, eye discharge, vision loss, eye pain, photophobia, earache, ear discharge, tinnitus, decreased hearing, nasal congestion, nosebleeds, sore throat, hoarseness, chest pain, palpitations, syncope, dyspnea on exertion, orthopnea, PND, peripheral edema, cough, dyspnea at rest, excessive sputum, hemoptysis, wheezing, pleurisy, nausea, vomiting, diarrhea, constipation, change in bowel habits, abdominal pain, melena, hematochezia, jaundice, gas/bloating, indigestion/heartburn, dysphagia, odynophagia, dysuria, hematuria, urinary frequency, urinary hesitancy, nocturia, incontinence, back pain, joint pain, joint swelling, muscle cramps, muscle weakness, stiffness, arthritis, sciatica, restless legs, leg pain at night, leg pain with exertion, itching, dryness, suspicious lesions, paralysis, paresthesias, seizures, tremors, vertigo, transient blindness, frequent falls, frequent headaches, difficulty walking, depression, anxiety, memory loss, confusion, cold intolerance, heat intolerance, polydipsia, polyphagia, polyuria, unusual weight change, abnormal bruising, bleeding, enlarged lymph nodes, urticaria, allergic rash, hay fever, and recurrent infections.    Vital Signs:  Patient profile:   45 year old male Height:      74.5 inches Weight:      218.50 pounds BMI:     27.78 O2 Sat:      99 % on Room air Temp:     98.3 degrees F oral Pulse rate:   65 / minute BP sitting:   122 / 66  (right arm) Cuff size:   regular  Vitals Entered By: Randell Loop CMA (January 21, 2011 3:38 PM)  O2 Sat at Rest %:  99 O2 Flow:  Room air CC: Yearly ROV & CPX... Is Patient Diabetic? No Pain Assessment Patient in pain? no      Comments meds updated today with pt   Physical Exam  Additional Exam:  WD, WN, 45 y/o WM in NAD... GENERAL:  Alert & oriented; pleasant & cooperative. HEENT:   [place]/AT, EOM-wnl, PERRLA, Fundi-benign, EACs-clear, TMs-wnl, NOSE-clear, THROAT-clear & wnl. NECK:  Supple w/ full ROM; no JVD; normal carotid impulses w/o bruits; no thyromegaly or nodules palpated; no lymphadenopathy. CHEST:  Clear to P & A; without wheezes/ rales/ or rhonchi. HEART:  Regular Rhythm; without murmurs/ rubs/ or gallops. ABDOMEN:  Soft & nontender; normal bowel sounds; no organomegaly or masses detected. RECTAL:  Neg - prostate 2+ & nontender w/o nodules; stool hematest neg. EXT: without deformities or arthritic changes; no varicose veins/ venous insuffic/ or edema. NEURO:  CN's intact; motor testing normal; sensory testing normal; gait normal & balance OK. DERM:  No lesions noted; +acne rosacea...    Impression & Recommendations:  Problem # 1:  PHYSICAL EXAMINATION (ICD-V70.0) We reviewed his hx, exam, labs etc... Orders: EKG w/ Interpretation (93000)  Problem # 2:  HYPERLIPIDEMIA (ICD-272.4) FLP looks good on the Prav40>  continue same + diet efforts w/ goal wt= 200# (BMI 25)... His updated medication list for this problem includes:    Pravastatin Sodium 40 Mg Tabs (Pravastatin sodium) .Marland Kitchen... Take 1 tab by mouth at bedtime...  Problem # 3:  ?  of HYPERTHYROIDISM, SUBCLINICAL (ICD-242.90) The initial low TSH in 2008 must have been lab error> clinically & biochem euthyroid...  Problem # 4:  DEGENERATIVE JOINT DISEASE (ICD-715.90) He uses Aleve as needed... His updated medication list for this problem includes:    Aleve 220 Mg Tabs (Naproxen sodium) .Marland Kitchen... Take 1 tablet by mouth once a day  Problem # 5:  PERSONAL HISTORY OF ALLERGY TO SEAFOOD (ICD-V15.04) He has experimented some & says that the allergy is primarily oysters which he knows to avoid...  Problem # 6:  OTHER MEDICAL PROBLEMS AS NOTED>>> Derm eval by DrGould> acne rosacea & started on rx...  Complete Medication List: 1)  Pravastatin Sodium 40 Mg Tabs (Pravastatin sodium) .... Take 1 tab by mouth at  bedtime.Marland KitchenMarland Kitchen 2)  Aleve 220 Mg Tabs (Naproxen sodium) .... Take 1 tablet by mouth once a day 3)  Metrogel 1 % Gel (Metronidazole) .... Apply to face daily  Patient Instructions: 1)  Today we updated your med list- see below.... 2)  We refilled your Pravastatin today... 3)  Keep up the good work w/ your diet & exercise (try to lose a few lbs w/ a goal  ~200#.Marland KitchenMarland Kitchen 4)  Today we gave you the combination tetanus vqccine called the TDAP... it is good for 81yrs. 5)  Call for any problems.Marland KitchenMarland Kitchen 6)  Please schedule a follow-up appointment in 1 year. Prescriptions: PRAVASTATIN SODIUM 40 MG TABS (PRAVASTATIN SODIUM) take 1 tab by mouth at bedtime...  #90 x 4   Entered and Authorized by:   Michele Mcalpine MD   Signed by:   Michele Mcalpine MD on 01/21/2011   Method used:   Print then Give to Patient   RxID:   [id number]    Immunization History:  Influenza Immunization History:    Influenza:  declined (01/21/2011)  Pneumovax Immunization History:    Pneumovax:  declined (01/21/2011)

## 2011-04-16 NOTE — Op Note (Signed)
NAME:  ARHAM, SYMMONDS NO.:  [id number]   MEDICAL RECORD NO.:  [id number]          PATIENT TYPE:  AMB   LOCATION:  DSC                          FACILITY:  [facility]   PHYSICIAN:  Katy Fitch. Sypher, M.D. DATE OF BIRTH:  1966-06-05   DATE OF PROCEDURE:  01/12/2008  DATE OF DISCHARGE:                               OPERATIVE REPORT   PREOPERATIVE DIAGNOSIS:  Painful mass left ring finger A2 pulley.   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   OPERATION:  Excision of myxoid cyst left ring finger A2 pulley.   OPERATING SURGEON:  Josephine Igo, M.D.   ASSISTANT:  Annye Rusk, PA-C   ANESTHESIA:  2% lidocaine metacarpal head block, left ring finger  supplemented by IV sedation, supervising anesthesiologist is Dr.  Sampson Goon.   INDICATIONS:  Tinsley Lomas is a 45 year old right-hand dominant  gentleman referred through the courtesy of Dr. Alroy Dust for  evaluation and management of a painful mass involving his left ring  finger.   Mr. Faison uses tools regularly and noted significant pain upon grasping  firm objects due to local pressure created by the mass.   He was referred for hand surgery consult and advised to seek surgical  removal.   PROCEDURE:  Wilkins Elpers is brought to the operating room and placed in  supine position on the operating table.   Following light sedation the left arm was prepped with Betadine soap  solution, sterilely draped.  A pneumatic tourniquet was applied to  proximal left brachium.   Following exsanguination left arm with Esmarch bandage, the arterial  tourniquet was inflated to 220 mmHg.  The procedure commenced with an  oblique incision directly over the palpable mass.  The subcutaneous  tissues were carefully divided revealing a 1 cm diameter purplish red  mass consistent with a myxoid cyst.  This involved the A2 pulley  directly.  This was circumferentially dissected and removed with small  portion of  the pulley at its base.   Bleeding points were electrocauterized with bipolar current.  The wound  was inspected.  No other masses identified.   The wound was then repaired with intradermal 3-0 Prolene suture.   A compressive dressing was applied with Xeroflo, sterile gauze and a  Coban dressing.   Mr. Pacitti was awakened from his sedation, transferred to recovery room  with stable vital signs.      Katy Fitch Sypher, M.D.  Electronically Signed     RVS/MEDQ  D:  01/12/2008  T:  01/13/2008  Job:  [id number]

## 2011-08-23 LAB — POCT HEMOGLOBIN-HEMACUE: Hemoglobin: 14.4

## 2012-01-22 ENCOUNTER — Ambulatory Visit (INDEPENDENT_AMBULATORY_CARE_PROVIDER_SITE_OTHER): Payer: Managed Care, Other (non HMO) | Admitting: Pulmonary Disease

## 2012-01-22 ENCOUNTER — Ambulatory Visit (INDEPENDENT_AMBULATORY_CARE_PROVIDER_SITE_OTHER)
Admission: RE | Admit: 2012-01-22 | Discharge: 2012-01-22 | Disposition: A | Discharge status: Home / Self Care | Payer: Managed Care, Other (non HMO) | Source: Ambulatory Visit | Attending: Pulmonary Disease | Admitting: Pulmonary Disease

## 2012-01-22 ENCOUNTER — Other Ambulatory Visit (INDEPENDENT_AMBULATORY_CARE_PROVIDER_SITE_OTHER): Payer: Managed Care, Other (non HMO)

## 2012-01-22 ENCOUNTER — Encounter: Payer: Self-pay | Admitting: Pulmonary Disease

## 2012-01-22 VITALS — BP 120/82 | HR 93 | Temp 97.6°F | Ht 74.0 in | Wt 224.0 lb

## 2012-01-22 DIAGNOSIS — Z91013 Allergy to seafood: Secondary | ICD-10-CM

## 2012-01-22 DIAGNOSIS — Z0000 Encounter for general adult medical examination without abnormal findings: Secondary | ICD-10-CM

## 2012-01-22 DIAGNOSIS — K649 Unspecified hemorrhoids: Secondary | ICD-10-CM

## 2012-01-22 DIAGNOSIS — E785 Hyperlipidemia, unspecified: Secondary | ICD-10-CM

## 2012-01-22 DIAGNOSIS — M1990 Unspecified osteoarthritis, unspecified site: Secondary | ICD-10-CM

## 2012-01-22 DIAGNOSIS — T7840XA Allergy, unspecified, initial encounter: Secondary | ICD-10-CM

## 2012-01-22 LAB — CBC WITH DIFFERENTIAL/PLATELET
Basophils Relative: 0.6 % (ref 0.0–3.0)
Eosinophils Relative: 0.4 % (ref 0.0–5.0)
Lymphocytes Relative: 12.1 % (ref 12.0–46.0)
Monocytes Absolute: 0.5 10*3/uL (ref 0.1–1.0)
Monocytes Relative: 9.4 % (ref 3.0–12.0)
Neutrophils Relative %: 77.5 % — ABNORMAL HIGH (ref 43.0–77.0)
Platelets: 288 10*3/uL (ref 150.0–400.0)
RBC: 4.61 Mil/uL (ref 4.22–5.81)
WBC: 5.5 10*3/uL (ref 4.5–10.5)

## 2012-01-22 LAB — BASIC METABOLIC PANEL
BUN: 18 mg/dL (ref 6–23)
Chloride: 102 mEq/L (ref 96–112)
Creatinine, Ser: 1.1 mg/dL (ref 0.4–1.5)
Glucose, Bld: 95 mg/dL (ref 70–99)
Potassium: 4.6 mEq/L (ref 3.5–5.1)

## 2012-01-22 LAB — HEPATIC FUNCTION PANEL
ALT: 17 U/L (ref 0–53)
AST: 24 U/L (ref 0–37)
Albumin: 4.3 g/dL (ref 3.5–5.2)
Total Bilirubin: 0.7 mg/dL (ref 0.3–1.2)
Total Protein: 7.9 g/dL (ref 6.0–8.3)

## 2012-01-22 LAB — URINALYSIS
Leukocytes, UA: NEGATIVE
Nitrite: NEGATIVE
Specific Gravity, Urine: 1.025 (ref 1.000–1.030)
pH: 6 (ref 5.0–8.0)

## 2012-01-22 LAB — LIPID PANEL
Cholesterol: 199 mg/dL (ref 0–200)
LDL Cholesterol: 110 mg/dL — ABNORMAL HIGH (ref 0–99)
Triglycerides: 40 mg/dL (ref 0.0–149.0)

## 2012-01-22 LAB — TSH: TSH: 0.51 u[IU]/mL (ref 0.35–5.50)

## 2012-01-22 MED ORDER — FEXOFENADINE-PSEUDOEPHED ER 180-240 MG PO TB24: 1.0000 | ORAL_TABLET | Freq: Every day | ORAL | Status: AC | PRN

## 2012-01-22 MED ORDER — PRAVASTATIN SODIUM 40 MG PO TABS: 40.0000 mg | ORAL_TABLET | Freq: Every day | ORAL | Status: DC

## 2012-01-22 NOTE — Patient Instructions (Signed)
Today we updated your med list in our EPIC system...    Continue your current medications the same...  Today we did your follow up CXR & fasting blood work...    Please call the PHONE [facility]REE in a few days for your results...    Dial [id number] & when prompted enter your patient number followed by the # symbol...    Your patient number is:  [id number]  Let's get on track w/ our diet & exercise program...    The goal is to lose 10-15# overall...  Call for any questions...  Let's plan another physical in 1 years time.Marland KitchenMarland Kitchen

## 2012-01-22 NOTE — Progress Notes (Signed)
Subjective:     Patient ID: Daniel Mercer, male   DOB: March 18, 1966, 46 y.o.   MRN: [id number]  HPI 46 y/o WM here for a follow up visit & CPX...   ~  [id number]:  he reports a good year and is without new complaints or concerns today... he has hypercholesterolemia controlled w/ Lescol XL80 but his co-pay has increased to ?3rd tier & we discussed change to poss 1st tier drug (ie- Prav40, he was prev intol to Lipitor & Zocor).  ~  January 21, 2011:  Daniel Mercer reports another good year overall- recently dx w/ Rosacea by DrGould, Derm & started on Metrogel & he is hopeful it will work for him...    Last yr we switched his Lescol to PRAVASTATIN 40mg /d & f/u FLP looks great on this med;  weight is down a few lbs to 219# & we discussed goal wt ~200# in his 6'3" frame...  Fasting labs are WNL, EKG shows NSR & NAD, he's a non-smoker & asymptomatic> CXR 2/11 clear...  ~  January 22, 2012:  Yearly ROV & CPX> Daniel Mercer has an ILI currently w/ chills, low grade temp, body aches, etc;  He didn't get the flu shot & says he never gets it & doesn't want it;  He'll take Tylenol, rest, fluids, etc...    Chol> on Prav40; FLP showed TChol 199, TG 40, HDL 81, LDL 110...    Hx borderline TFTs> TSH in 2008 was sl low but has been wnl since then & he is clinically euthyroid; TSH= 0.51 & he denies symptoms...    GI> he is 46 y/o, denies GI symptoms; he will be due for routine colon screening around age 85; beer drinker- LFTs remain wnl...    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 CXR 2/13 showed normal heart size, clear lungs, degen changes in TSpine... LABS 2/13 showed:  FLP- ok on Prav40 w/ LDL=110;  Chems- wnl;  CBC- wnl x MCV=104;  TSH=0.51;  UA- clear...             Problem List:    Hx of SINUSITIS (ICD-473.9) - uses OTC antihist/ decongestant as needed... no recent infections or problems.  HYPERLIPIDEMIA (ICD-272.4) - on  PRAVASTATIN 40mg /d (prev intol to Lipitor & Zocor)... his HDL is quite high>>> ~  FLP 7/08 on Lesc80 showed TChol 204, TG 71, HDL 70, LDL 120 ~  FLP 11/09 on Lesc80 showed TChol 197, TG 31, HDL 72, LDL 119 ~  FLP 2/11 on Lesc80 showed TChol 192, TG 42, HDL 84, LDL 100... rec> change to Prav40$$. ~  FLP 2/12 on Prav40 showed TChol 192, TG 60, HDL 80, LDL 100 ~  FLP 2/13 on Prav40 showed TChol 199, TG 40, HDL 81, LDL 110  ? of HYPERTHYROIDISM, SUBCLINICAL (ICD-242.90) - labs done 7/08 showed TSH= 0.02 (lab error?), and repeat studies showed TSH= 0.98 w/ norm FreeT3 & FreeT4... gland feels normal without goiter or nodules palpated... ~  labs 11/09 showed TSH= 0.68 ~  labs 2/11 showed TSH= 0.73 ~  labs 2/12 showed TSH= 0.58, FreeT3= 3.8 (2.3-4.2), FreeT4= 0.88 (0.60-1.60) ~  Labs 2/13 showed TSH= 0.51  Hx of HEMORRHOIDS (ICD-455.6) - no prev colonoscopy etc.. no recent problems. ~  2/12 & 2/13:  rectal exam= neg, 2+ norm prostate, stool heme neg.  BEER DRINKER (ICD-305.00) - Hx 12/wk, normal LFT's, no problems...  DEGENERATIVE JOINT DISEASE (ICD-715.90) -  he takes ALEVE OTC Prn... hx LBP w/ eval & Rx by DrPCarter in the 1990s (?HNP & Rx w/ exercises- improved, no surg)...  PERSONAL HISTORY OF ALLERGY TO SEAFOOD (ICD-V15.04) - Urticaria 5/07 believed from seafood... he has experimented some since then & says "it's oysters" but notes that he had neg food allergy testing from DrESL in past ("I'm ok if I only eat a few").  Health Maintenance ~  GI:  neg fam hx of colon cancer or polyps, rectal exam is OK & stool is heme neg. ~  GU:  he is asymptomatic, prostate is 2+ normal, no nodules etc. ~  Immunizations:  he hasn't had Flu shots, and doesn't want them... no indication for pre-age65 pneumovax... TDAP given 2/12...   Past Surgical History  Procedure Date  . Bilateral inguinal hernia repairs     left in 1997, right in 2003    Outpatient Encounter Prescriptions as of 01/22/2012  Medication  Sig Dispense Refill  . acetaminophen (TYLENOL) 500 MG tablet Take 500 mg by mouth every 6 (six) hours as needed.      . metroNIDAZOLE (METROGEL) 1 % gel Apply 1 application topically daily.      . naproxen sodium (ANAPROX) 220 MG tablet Take 220 mg by mouth daily.      . pravastatin (PRAVACHOL) 40 MG tablet Take 40 mg by mouth daily.        Allergies  Allergen Reactions  . Atorvastatin     REACTION: pt states intol to LIPITOR in past  . Simvastatin     REACTION: pt states intol to ZOCOR in past    Current Medications, Allergies, Past Medical History, Past Surgical History, Family History, and Social History were reviewed in [employer] record.   Review of Systems         The patient complains of rash.  The patient denies fever, chills, sweats, anorexia, fatigue, weakness, malaise, weight loss, sleep disorder, blurring, diplopia, eye irritation, eye discharge, vision loss, eye pain, photophobia, earache, ear discharge, tinnitus, decreased hearing, nasal congestion, nosebleeds, sore throat, hoarseness, chest pain, palpitations, syncope, dyspnea on exertion, orthopnea, PND, peripheral edema, cough, dyspnea at rest, excessive sputum, hemoptysis, wheezing, pleurisy, nausea, vomiting, diarrhea, constipation, change in bowel habits, abdominal pain, melena, hematochezia, jaundice, gas/bloating, indigestion/heartburn, dysphagia, odynophagia, dysuria, hematuria, urinary frequency, urinary hesitancy, nocturia, incontinence, back pain, joint pain, joint swelling, muscle cramps, muscle weakness, stiffness, arthritis, sciatica, restless legs, leg pain at night, leg pain with exertion, itching, dryness, suspicious lesions, paralysis, paresthesias, seizures, tremors, vertigo, transient blindness, frequent falls, frequent headaches, difficulty walking, depression, anxiety, memory loss, confusion, cold intolerance, heat intolerance, polydipsia, polyphagia, polyuria, unusual weight change,  abnormal bruising, bleeding, enlarged lymph nodes, urticaria, allergic rash, hay fever, and recurrent infections.     Objective:   Physical Exam    WD, WN, 46 y/o WM in NAD... GENERAL:  Alert & oriented; pleasant & cooperative. HEENT:  [place]/AT, EOM-wnl, PERRLA, Fundi-benign, EACs-clear, TMs-wnl, NOSE-clear, THROAT-clear & wnl. NECK:  Supple w/ full ROM; no JVD; normal carotid impulses w/o bruits; no thyromegaly or nodules palpated; no lymphadenopathy. CHEST:  Clear to P & A; without wheezes/ rales/ or rhonchi. HEART:  Regular Rhythm; without murmurs/ rubs/ or gallops. ABDOMEN:  Soft & nontender; normal bowel sounds; no organomegaly or masses detected. RECTAL:  Neg - prostate 2+ & nontender w/o nodules; stool hematest neg. EXT: without deformities or arthritic changes; no varicose veins/ venous insuffic/ or edema. NEURO:  CN's intact; motor testing normal; sensory  testing normal; gait normal & balance OK. DERM:  No lesions noted; +acne rosacea...  RADIOLOGY DATA:  Reviewed in the EPIC EMR & discussed w/ the patient...    >>CXR 2/13 showed normal heart size, clear lungs, degen changes in TSpine...  LABORATORY DATA:  Reviewed in the EPIC EMR & discussed w/ the patient...    >>LABS 2/13 showed:  FLP- ok on Prav40 w/ LDL=110;  Chems- wnl;  CBC- wnl x MCV=104;  TSH=0.51;  UA- clear...      Assessment:     Physical Exam>>  Hyperlipid>  Controlled on Prav40, continue same + diet efforts...  Hx subclinical Hyperthy>  He remains biochem & clinically euthyroid...  Hx Hems>  Aware, rectal exam neg, stool heme neg...  Beer Drinker>  Aware, rec to cutback, LFTs remain wnl...  DJD>  Aware, uses ankle brace per DrPCarter & OTC analgesics...  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     Plan:     Patient's Medications  New Prescriptions   FEXOFENADINE-PSEUDOEPHEDRINE (ALLEGRA-D 24 HOUR) 180-240 MG PER 24 HR TABLET    Take 1 tablet by mouth  daily as needed.  Previous Medications   ACETAMINOPHEN (TYLENOL) 500 MG TABLET    Take 500 mg by mouth every 6 (six) hours as needed.   METRONIDAZOLE (METROGEL) 1 % GEL    Apply 1 application topically daily.   NAPROXEN SODIUM (ANAPROX) 220 MG TABLET    Take 220 mg by mouth daily.  Modified Medications   Modified Medication Previous Medication   PRAVASTATIN (PRAVACHOL) 40 MG TABLET pravastatin (PRAVACHOL) 40 MG tablet      Take 1 tablet (40 mg total) by mouth daily.    Take 40 mg by mouth daily.  Discontinued Medications   No medications on file

## 2012-07-24 ENCOUNTER — Other Ambulatory Visit: Payer: Self-pay | Admitting: Pulmonary Disease

## 2012-12-22 ENCOUNTER — Encounter: Payer: Self-pay | Admitting: Adult Health

## 2012-12-22 ENCOUNTER — Ambulatory Visit (INDEPENDENT_AMBULATORY_CARE_PROVIDER_SITE_OTHER): Payer: BC Managed Care – PPO | Admitting: Adult Health

## 2012-12-22 VITALS — BP 136/96 | HR 93 | Temp 100.7°F | Ht 74.0 in | Wt 226.4 lb

## 2012-12-22 DIAGNOSIS — J111 Influenza due to unidentified influenza virus with other respiratory manifestations: Secondary | ICD-10-CM

## 2012-12-22 MED ORDER — AZITHROMYCIN 250 MG PO TABS: ORAL_TABLET | ORAL | Status: AC

## 2012-12-22 MED ORDER — HYDROCODONE-HOMATROPINE 5-1.5 MG/5ML PO SYRP: 5.0000 mL | ORAL_SOLUTION | Freq: Four times a day (QID) | ORAL | Status: AC | PRN

## 2012-12-22 NOTE — Assessment & Plan Note (Signed)
URI vs Flu  Day 3-4 , no tamiflu at this time.   Plan  Z-Pak Take as directed. Mucinex DM twice daily as needed. For cough and congestion. Fluid at rest. Tylenol as needed. For fever. Hydromet 1 teaspoon every 4-6 hours as needed. For cough, may make you sleepy Please contact office for sooner follow up if symptoms do not improve or worsen or seek emergency care

## 2012-12-22 NOTE — Progress Notes (Signed)
  Subjective:    Patient ID: Daniel Mercer, male    DOB: [birth year], 47 y.o.   MRN: [id number]  HPI 47 year old male with a known history of hyperlipidemia.  12/22/2012 Acute OV  Complains of low grade fever, body aches, prod cough with brown/green mucus, some wheezing x3-4 days - denies SOB, tightness Patient denies any hemoptysis, nausea, vomiting, and recent travel or antibiotic use. Patient reports that several coworkers have had similar symptoms. Patient has not used any over-the-counter medications for treatment.    Review of Systems Constitutional:   No  weight loss, night sweats,  + Fevers, chills, fatigue, or  lassitude.  HEENT:   No headaches,  Difficulty swallowing,  Tooth/dental problems, or   +Sore throat,                No sneezing, itching, ear ache, +nasal congestion, post nasal drip,   CV:  No chest pain,  Orthopnea, PND, swelling in lower extremities, anasarca, dizziness, palpitations, syncope.   GI  No heartburn, indigestion, abdominal pain, nausea, vomiting, diarrhea, change in bowel habits, loss of appetite, bloody stools.   Resp: No shortness of breath with exertion or at rest.    No wheezing.  No chest wall deformity  Skin: no rash or lesions.  GU: no dysuria, change in color of urine, no urgency or frequency.  No flank pain, no hematuria   MS:  No joint pain or swelling.  No decreased range of motion.  No back pain.  Psych:  No change in mood or affect. No depression or anxiety.  No memory loss.         Objective:   Physical Exam GEN: A/Ox3; pleasant , NAD, well nourished   HEENT:  [place]/AT,  EACs-clear, TMs-wnl, NOSE-clear, THROAT-mild redness , no lesions, no postnasal drip or exudate noted.   NECK:  Supple w/ fair ROM; no JVD; normal carotid impulses w/o bruits; no thyromegaly or nodules palpated; no lymphadenopathy.  RESP  Clear  P & A; w/o, wheezes/ rales/ or rhonchi.no accessory muscle use, no dullness to percussion  CARD:  RRR, no m/r/g   , no peripheral edema, pulses intact, no cyanosis or clubbing.  GI:   Soft & nt; nml bowel sounds; no organomegaly or masses detected.  Musco: Warm bil, no deformities or joint swelling noted.   Neuro: alert, no focal deficits noted.    Skin: Warm, no lesions or rashes         Assessment & Plan:

## 2012-12-22 NOTE — Patient Instructions (Addendum)
Z-Pak Take as directed. Mucinex DM twice daily as needed. For cough and congestion. Fluid at rest. Tylenol as needed. For fever. Hydromet 1 teaspoon every 4-6 hours as needed. For cough, may make you sleepy Please contact office for sooner follow up if symptoms do not improve or worsen or seek emergency care

## 2013-01-21 ENCOUNTER — Ambulatory Visit (INDEPENDENT_AMBULATORY_CARE_PROVIDER_SITE_OTHER): Payer: BC Managed Care – PPO | Admitting: Pulmonary Disease

## 2013-01-21 ENCOUNTER — Ambulatory Visit (INDEPENDENT_AMBULATORY_CARE_PROVIDER_SITE_OTHER)
Admission: RE | Admit: 2013-01-21 | Discharge: 2013-01-21 | Disposition: A | Discharge status: Home / Self Care | Payer: BC Managed Care – PPO | Source: Ambulatory Visit | Attending: Pulmonary Disease | Admitting: Pulmonary Disease

## 2013-01-21 ENCOUNTER — Other Ambulatory Visit (INDEPENDENT_AMBULATORY_CARE_PROVIDER_SITE_OTHER): Payer: BC Managed Care – PPO

## 2013-01-21 ENCOUNTER — Encounter: Payer: Self-pay | Admitting: Pulmonary Disease

## 2013-01-21 VITALS — BP 130/80 | HR 72 | Temp 99.0°F | Ht 74.0 in | Wt 225.8 lb

## 2013-01-21 DIAGNOSIS — M1990 Unspecified osteoarthritis, unspecified site: Secondary | ICD-10-CM

## 2013-01-21 DIAGNOSIS — Z91013 Allergy to seafood: Secondary | ICD-10-CM

## 2013-01-21 DIAGNOSIS — E785 Hyperlipidemia, unspecified: Secondary | ICD-10-CM

## 2013-01-21 DIAGNOSIS — Z0000 Encounter for general adult medical examination without abnormal findings: Secondary | ICD-10-CM

## 2013-01-21 LAB — CBC WITH DIFFERENTIAL/PLATELET
Basophils Absolute: 0 10*3/uL (ref 0.0–0.1)
Basophils Relative: 0.7 % (ref 0.0–3.0)
Eosinophils Absolute: 0.2 10*3/uL (ref 0.0–0.7)
HCT: 43.8 % (ref 39.0–52.0)
Hemoglobin: 14.9 g/dL (ref 13.0–17.0)
Lymphocytes Relative: 32.1 % (ref 12.0–46.0)
Lymphs Abs: 1.7 10*3/uL (ref 0.7–4.0)
MCHC: 34.1 g/dL (ref 30.0–36.0)
MCV: 102.5 fl — ABNORMAL HIGH (ref 78.0–100.0)
Monocytes Absolute: 0.6 10*3/uL (ref 0.1–1.0)
Neutro Abs: 2.7 10*3/uL (ref 1.4–7.7)
RBC: 4.27 Mil/uL (ref 4.22–5.81)
RDW: 12.2 % (ref 11.5–14.6)

## 2013-01-21 LAB — LIPID PANEL
Total CHOL/HDL Ratio: 3
Triglycerides: 61 mg/dL (ref 0.0–149.0)
VLDL: 12.2 mg/dL (ref 0.0–40.0)

## 2013-01-21 LAB — HEPATIC FUNCTION PANEL
Alkaline Phosphatase: 89 U/L (ref 39–117)
Bilirubin, Direct: 0.1 mg/dL (ref 0.0–0.3)
Total Bilirubin: 0.9 mg/dL (ref 0.3–1.2)
Total Protein: 7.7 g/dL (ref 6.0–8.3)

## 2013-01-21 LAB — BASIC METABOLIC PANEL
CO2: 29 mEq/L (ref 19–32)
Calcium: 9.2 mg/dL (ref 8.4–10.5)
Creatinine, Ser: 1 mg/dL (ref 0.4–1.5)
Sodium: 137 mEq/L (ref 135–145)

## 2013-01-21 MED ORDER — PRAVASTATIN SODIUM 40 MG PO TABS: 40.0000 mg | ORAL_TABLET | Freq: Every day | ORAL | Status: DC

## 2013-01-21 NOTE — Progress Notes (Signed)
Subjective:     Patient ID: Daniel Mercer, male   DOB: 09/11/1966, 47 y.o.   MRN: [id number]  HPI 47 y/o WM here for a follow up visit & CPX...   ~  January 21, 2011:  Daniel Mercer reports another good year overall- recently dx w/ Rosacea by DrGould, Derm & started on Metrogel & he is hopeful it will work for him...    Last yr we switched his Lescol to PRAVASTATIN 40mg /d & f/u FLP looks great on this med;  weight is down a few lbs to 219# & we discussed goal wt ~200# in his 6'3" frame...  Fasting labs are WNL, EKG shows NSR & NAD, he's a non-smoker & asymptomatic> CXR 2/11 clear...  ~  January 22, 2012:  Yearly ROV & CPX> Daniel Mercer has an ILI currently w/ chills, low grade temp, body aches, etc;  He didn't get the flu shot & says he never gets it & doesn't want it;  He'll take Tylenol, rest, fluids, etc...    Chol> on Prav40; FLP showed TChol 199, TG 40, HDL 81, LDL 110...    Hx borderline TFTs> TSH in 2008 was sl low but has been wnl since then & he is clinically euthyroid; TSH= 0.51 & he denies symptoms...    GI> he is 47 y/o, denies GI symptoms; he will be due for routine colon screening around age 27; beer drinker- LFTs remain wnl...    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 CXR 2/13 showed normal heart size, clear lungs, degen changes in TSpine... LABS 2/13 showed:  FLP- ok on Prav40 w/ LDL=110;  Chems- wnl;  CBC- wnl x MCV=104;  TSH=0.51;  UA- clear...  ~  January 21, 2013:  1year ROV & CPX> Daniel Mercer reports a good year, no new complaints or concerns... We reviewed the following medical problems during today's office visit >>     Chol> on Prav40; FLP showed TChol 205, TG 61, HDL 73, LDL 104... rec to continue same + better dier, exercise...    Hx borderline TFTs> TSH in 2008 was sl low but has been wnl since then & he is clinically euthyroid; TSH= 0.61 & he denies symptoms...    GI> he is 47 y/o, denies  GI symptoms; he will be due for routine colon screening around age 32; beer drinker- LFTs remain wnl...    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 We reviewed prob list, meds, xrays and labs> see below for updates >>  CXR 2/14 showed normal heart size, clear lungs, NAD.Marland KitchenMarland Kitchen EKG 2/14 showed NSR, rate67, WNL, NAD... LABS 2/14:  FLP- ok on Prav40;  Chems- wnl;  CBC- wnl;  TSH=0.61...              Problem List:    Hx of SINUSITIS (ICD-473.9) - uses OTC antihist/ decongestant as needed... no recent infections or problems.  HYPERLIPIDEMIA (ICD-272.4) - on PRAVASTATIN 40mg /d (prev intol to Lipitor & Zocor)... his HDL is quite high>>> ~  FLP 7/08 on Lesc80 showed TChol 204, TG 71, HDL 70, LDL 120 ~  FLP 11/09 on Lesc80 showed TChol 197, TG 31, HDL 72, LDL 119 ~  FLP 2/11 on Lesc80 showed TChol 192, TG 42, HDL 84, LDL 100... rec> change to Prav40$$. ~  FLP 2/12 on  Prav40 showed TChol 192, TG 60, HDL 80, LDL 100 ~  FLP 2/13 on Prav40 showed TChol 199, TG 40, HDL 81, LDL 110 ~  FLP 2/14 on Prav40 showed TChol 205, TG 61, HDL 73, LDL 104  ? of HYPERTHYROIDISM, SUBCLINICAL (ICD-242.90) - labs done 7/08 showed TSH= 0.02 (lab error?), and repeat studies showed TSH= 0.98 w/ norm FreeT3 & FreeT4... gland feels normal without goiter or nodules palpated... ~  labs 11/09 showed TSH= 0.68 ~  labs 2/11 showed TSH= 0.73 ~  labs 2/12 showed TSH= 0.58, FreeT3= 3.8 (2.3-4.2), FreeT4= 0.88 (0.60-1.60) ~  Labs 2/13 showed TSH= 0.51 ~  Labs 2/14 showed TSH= 0.61  Hx of HEMORRHOIDS (ICD-455.6) - no prev colonoscopy etc.. no recent problems. ~  2/12 & 2/13:  rectal exam= neg, 2+ norm prostate, stool heme neg.  BEER DRINKER (ICD-305.00) - Hx 12/wk, normal LFT's, no problems...  DEGENERATIVE JOINT DISEASE (ICD-715.90) - he takes ALEVE OTC Prn... hx LBP w/ eval & Rx by DrPCarter in the 1990s (?HNP  & Rx w/ exercises- improved, no surg)...  PERSONAL HISTORY OF ALLERGY TO SEAFOOD (ICD-V15.04) - Urticaria 5/07 believed from seafood... he has experimented some since then & says "it's oysters" but notes that he had neg food allergy testing from DrESL in past ("I'm ok if I only eat a few").  Health Maintenance ~  GI:  neg fam hx of colon cancer or polyps, rectal exam is OK & stool is heme neg. ~  GU:  he is asymptomatic, prostate is 2+ normal, no nodules etc. ~  Immunizations:  he hasn't had Flu shots, and doesn't want them... no indication for pre-age65 pneumovax... TDAP given 2/12...   Past Surgical History  Procedure Laterality Date  . Bilateral inguinal hernia repairs      left in 1997, right in 2003    Outpatient Encounter Prescriptions as of 01/21/2013  Medication Sig Dispense Refill  . acetaminophen (TYLENOL) 500 MG tablet Take 500 mg by mouth every 6 (six) hours as needed.      . fexofenadine-pseudoephedrine (ALLEGRA-D 24 HOUR) 180-240 MG per 24 hr tablet Take 1 tablet by mouth daily as needed.  30 tablet  5  . metroNIDAZOLE (METROGEL) 1 % gel Apply 1 application topically daily.      . naproxen sodium (ANAPROX) 220 MG tablet Take 220 mg by mouth daily.      . pravastatin (PRAVACHOL) 40 MG tablet TAKE 1 TABLET BY MOUTH ONCE DAILY  30 tablet  5   No facility-administered encounter medications on file as of 01/21/2013.    Allergies  Allergen Reactions  . Atorvastatin     REACTION: pt states intol to LIPITOR in past  . Simvastatin     REACTION: pt states intol to ZOCOR in past    Current Medications, Allergies, Past Medical History, Past Surgical History, Family History, and Social History were reviewed in [employer] record.   Review of Systems         The patient complains of rash.  The patient denies fever, chills, sweats, anorexia, fatigue, weakness, malaise, weight loss, sleep disorder, blurring, diplopia, eye irritation, eye discharge, vision  loss, eye pain, photophobia, earache, ear discharge, tinnitus, decreased hearing, nasal congestion, nosebleeds, sore throat, hoarseness, chest pain, palpitations, syncope, dyspnea on exertion, orthopnea, PND, peripheral edema, cough, dyspnea at rest, excessive sputum, hemoptysis, wheezing, pleurisy, nausea, vomiting, diarrhea, constipation, change in bowel habits, abdominal pain, melena, hematochezia, jaundice, gas/bloating, indigestion/heartburn, dysphagia, odynophagia, dysuria, hematuria, urinary frequency,  urinary hesitancy, nocturia, incontinence, back pain, joint pain, joint swelling, muscle cramps, muscle weakness, stiffness, arthritis, sciatica, restless legs, leg pain at night, leg pain with exertion, itching, dryness, suspicious lesions, paralysis, paresthesias, seizures, tremors, vertigo, transient blindness, frequent falls, frequent headaches, difficulty walking, depression, anxiety, memory loss, confusion, cold intolerance, heat intolerance, polydipsia, polyphagia, polyuria, unusual weight change, abnormal bruising, bleeding, enlarged lymph nodes, urticaria, allergic rash, hay fever, and recurrent infections.     Objective:   Physical Exam    WD, WN, 47 y/o WM in NAD... GENERAL:  Alert & oriented; pleasant & cooperative. HEENT:  [place]/AT, EOM-wnl, PERRLA, Fundi-benign, EACs-clear, TMs-wnl, NOSE-clear, THROAT-clear & wnl. NECK:  Supple w/ full ROM; no JVD; normal carotid impulses w/o bruits; no thyromegaly or nodules palpated; no lymphadenopathy. CHEST:  Clear to P & A; without wheezes/ rales/ or rhonchi. HEART:  Regular Rhythm; without murmurs/ rubs/ or gallops. ABDOMEN:  Soft & nontender; normal bowel sounds; no organomegaly or masses detected. RECTAL:  Neg - prostate 2+ & nontender w/o nodules; stool hematest neg. EXT: without deformities or arthritic changes; no varicose veins/ venous insuffic/ or edema. NEURO:  CN's intact; motor testing normal; sensory testing normal; gait normal &  balance OK. DERM:  No lesions noted; +acne rosacea...  RADIOLOGY DATA:  Reviewed in the EPIC EMR & discussed w/ the patient...  LABORATORY DATA:  Reviewed in the EPIC EMR & discussed w/ the patient...      Assessment:     Physical Exam>>  Hyperlipid>  Controlled on Prav40, continue same + diet efforts...  Hx subclinical Hyperthy>  He remains biochem & clinically euthyroid...  Hx Hems>  Aware, rectal exam neg, stool heme neg...  Beer Drinker>  Aware, rec to cutback, LFTs remain wnl...  DJD>  Aware, uses ankle brace per DrPCarter & OTC analgesics...  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     Plan:     Patient's Medications  New Prescriptions   No medications on file  Previous Medications   ACETAMINOPHEN (TYLENOL) 500 MG TABLET    Take 500 mg by mouth every 6 (six) hours as needed.   METRONIDAZOLE (METROGEL) 1 % GEL    Apply 1 application topically daily.   NAPROXEN SODIUM (ANAPROX) 220 MG TABLET    Take 220 mg by mouth daily.  Modified Medications   Modified Medication Previous Medication   PRAVASTATIN (PRAVACHOL) 40 MG TABLET pravastatin (PRAVACHOL) 40 MG tablet      Take 1 tablet (40 mg total) by mouth daily.    TAKE 1 TABLET BY MOUTH ONCE DAILY  Discontinued Medications   No medications on file

## 2013-01-21 NOTE — Patient Instructions (Addendum)
Today we updated your med list in our EPIC system...    Continue your current medications the same...  Today we did your follow up CXR, EKG, & fasting blood work...     We will contact you w/ the results when avail...  Call for any problems...  Let's continue our yearly check ups.Marland KitchenMarland Kitchen

## 2014-01-21 ENCOUNTER — Encounter: Payer: Self-pay | Admitting: Pulmonary Disease

## 2014-01-21 ENCOUNTER — Encounter (INDEPENDENT_AMBULATORY_CARE_PROVIDER_SITE_OTHER): Payer: Self-pay

## 2014-01-21 ENCOUNTER — Ambulatory Visit (INDEPENDENT_AMBULATORY_CARE_PROVIDER_SITE_OTHER): Payer: BC Managed Care – PPO | Admitting: Pulmonary Disease

## 2014-01-21 VITALS — BP 126/84 | HR 68 | Temp 97.8°F | Ht 74.0 in | Wt 226.2 lb

## 2014-01-21 DIAGNOSIS — Z91013 Allergy to seafood: Secondary | ICD-10-CM

## 2014-01-21 DIAGNOSIS — M1990 Unspecified osteoarthritis, unspecified site: Secondary | ICD-10-CM

## 2014-01-21 DIAGNOSIS — F1010 Alcohol abuse, uncomplicated: Secondary | ICD-10-CM

## 2014-01-21 DIAGNOSIS — L719 Rosacea, unspecified: Secondary | ICD-10-CM

## 2014-01-21 DIAGNOSIS — Z0000 Encounter for general adult medical examination without abnormal findings: Secondary | ICD-10-CM

## 2014-01-21 DIAGNOSIS — E785 Hyperlipidemia, unspecified: Secondary | ICD-10-CM

## 2014-01-21 MED ORDER — PRAVASTATIN SODIUM 40 MG PO TABS: 40.0000 mg | ORAL_TABLET | Freq: Every day | ORAL | Status: DC

## 2014-01-21 NOTE — Patient Instructions (Signed)
Today we updated your med list in our EPIC system...    Continue your current medications the same...    We refilled the Pravastatin 40mg  per request...  Please return to our lab one morning next week for your FASTING blood work...    We will contact you w/ the results when available...   Call for any questions or if we can be of service in any way.Marland KitchenMarland Kitchen

## 2014-01-21 NOTE — Progress Notes (Signed)
Subjective:     Patient ID: Daniel Mercer, male   DOB: [birth year], 48 y.o.   MRN: [id number]  HPI 48 y/o WM here for a follow up visit & CPX...   ~  January 21, 2011:  Daniel Mercer reports another good year overall- recently dx w/ Rosacea by DrGould, Derm & started on Metrogel & he is hopeful it will work for him...    Last yr we switched his Lescol to PRAVASTATIN 40mg /d & f/u FLP looks great on this med;  weight is down a few lbs to 219# & we discussed goal wt ~200# in his 6'3" frame...  Fasting labs are WNL, EKG shows NSR & NAD, he's a non-smoker & asymptomatic> CXR 2/11 clear...  ~  January 22, 2012:  Yearly ROV & CPX> Daniel Mercer has an ILI currently w/ chills, low grade temp, body aches, etc;  He didn't get the flu shot & says he never gets it & doesn't want it;  He'll take Tylenol, rest, fluids, etc...    Chol> on Prav40; FLP showed TChol 199, TG 40, HDL 81, LDL 110...    Hx borderline TFTs> TSH in 2008 was sl low but has been wnl since then & he is clinically euthyroid; TSH= 0.51 & he denies symptoms...    GI> he is 48 y/o, denies GI symptoms; he will be due for routine colon screening around age 62; beer drinker- LFTs remain wnl...    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  CXR 2/13 showed normal heart size, clear lungs, degen changes in TSpine...  LABS 2/13 showed:  FLP- ok on Prav40 w/ LDL=110;  Chems- wnl;  CBC- wnl x MCV=104;  TSH=0.51;  UA- clear...  ~  January 21, 2013:  1year ROV & CPX> Daniel Mercer reports a good year, no new complaints or concerns... We reviewed the following medical problems during today's office visit >>     Chol> on Prav40; FLP showed TChol 205, TG 61, HDL 73, LDL 104... rec to continue same + better dier, exercise...    Hx borderline TFTs> TSH in 2008 was sl low but has been wnl since then & he is clinically euthyroid; TSH= 0.61 & he denies symptoms...    GI> he is 48 y/o,  denies GI symptoms; he will be due for routine colon screening around age 19; beer drinker- LFTs remain wnl...    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 We reviewed prob list, meds, xrays and labs> see below for updates >>   CXR 2/14 showed normal heart size, clear lungs, NAD.Marland KitchenMarland Kitchen  EKG 2/14 showed NSR, rate67, WNL, NAD...  LABS 2/14:  FLP- ok on Prav40;  Chems- wnl;  CBC- wnl;  TSH=0.61...  ~  January 21, 2014:  Yearly ROV & CPX> Daniel Mercer reports a stress fx of his left foot this past yr & treated by DrVoytek (boot, then cast x3wks, now resolved);  He also sees [place] for his skin condition... We reviewed the following medical problems during today's office visit >>     Chol> on Prav40; FLP 2/15 showed TChol 194, TG 140, HDL 78, LDL 88... rec to continue same + better diet, exercise, get wt down...    Hx borderline TFTs> TSH in 2008 was sl low but has been wnl since then &  he is clinically euthyroid; Labs 2/15 showed TSH= 1.55 & he denies symptoms...    GI> he is 48 y/o, denies GI symptoms; he will be due for routine colon screening around age 22; beer drinker- LFTs remain wnl x GOT=40 and MCV=107...    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 We reviewed prob list, meds, xrays and labs> see below for updates >> he declines the Flu vaccine...  LABS 2/15:  FLP- at goals on Prav40;  Chems- wnl x GOT=40;  CBC- wnl x MCV=107;  TSH=1.55;  PSA=0.32...            Problem List:    Hx of SINUSITIS (ICD-473.9) - uses OTC antihist/ decongestant as needed... no recent infections or problems.  HYPERLIPIDEMIA (ICD-272.4) - on PRAVASTATIN 40mg /d (prev intol to Lipitor & Zocor)... his HDL is quite high>>> ~  FLP 7/08 on Lesc80  showed TChol 204, TG 71, HDL 70, LDL 120 ~  FLP 11/09 on Lesc80 showed TChol 197, TG 31, HDL 72, LDL 119 ~  FLP 2/11 on Lesc80 showed TChol 192, TG 42, HDL 84, LDL 100... rec> change to Prav40$$. ~  FLP 2/12 on Prav40 showed TChol 192, TG 60, HDL 80, LDL 100 ~  FLP 2/13 on Prav40 showed TChol 199, TG 40, HDL 81, LDL 110 ~  FLP 2/14 on Prav40 showed TChol 205, TG 61, HDL 73, LDL 104 ~  FLP 2/15 on Prav40 showed TChol 194, TG 140, HDL 78, LDL 88  ? of HYPERTHYROIDISM, SUBCLINICAL (ICD-242.90) - labs done 7/08 showed TSH= 0.02 (lab error?), and repeat studies showed TSH= 0.98 w/ norm FreeT3 & FreeT4... gland feels normal without goiter or nodules palpated... ~  labs 11/09 showed TSH= 0.68 ~  labs 2/11 showed TSH= 0.73 ~  labs 2/12 showed TSH= 0.58, FreeT3= 3.8 (2.3-4.2), FreeT4= 0.88 (0.60-1.60) ~  Labs 2/13 showed TSH= 0.51 ~  Labs 2/14 showed TSH= 0.61 ~  Labs 2/15 showed TSH= 1.55 & he denies symptoms  Hx of HEMORRHOIDS (ICD-455.6) - no prev colonoscopy etc.. no recent problems. ~  2/12 & 2/13:  rectal exam= neg, 2+ norm prostate, stool heme neg.  BEER DRINKER (ICD-305.00) - Hx 12/wk, normal LFT's, no problems... ~  2/15:  Note that GOT= 40, and MCV= 107  DEGENERATIVE JOINT DISEASE (ICD-715.90) - he takes ALEVE OTC Prn... hx LBP w/ eval & Rx by DrPCarter in the 1990s (?HNP & Rx w/ exercises- improved, no surg)...  PERSONAL HISTORY OF ALLERGY TO SEAFOOD (ICD-V15.04) - Urticaria 5/07 believed from seafood... he has experimented some since then & says "it's oysters" but notes that he had neg food allergy testing from DrESL in past ("I'm ok if I only eat a few").  Health Maintenance ~  GI:  neg fam hx of colon cancer or polyps, rectal exam is OK & stool is heme neg. ~  GU:  he is asymptomatic, prostate is 2+ normal, no nodules etc. ~  Immunizations:  he hasn't had Flu shots, and doesn't want them... no indication for pre-age65 pneumovax... TDAP given 2/12...   Past Surgical History   Procedure Laterality Date  . Bilateral inguinal hernia repairs      left in 1997, right in 2003    Outpatient Encounter Prescriptions as of 01/21/2014  Medication Sig  . acetaminophen (TYLENOL) 500 MG tablet Take 500 mg by  mouth every 6 (six) hours as needed.  . Azelaic Acid 15 % cream Apply 1 application topically 2 (two) times daily. After skin is thoroughly washed and patted dry, gently but thoroughly massage a thin film of azelaic acid cream into the affected area twice daily, in the morning and evening.  . naproxen sodium (ANAPROX) 220 MG tablet Take 220 mg by mouth daily.  . pravastatin (PRAVACHOL) 40 MG tablet Take 1 tablet (40 mg total) by mouth daily.  . [DISCONTINUED] metroNIDAZOLE (METROGEL) 1 % gel Apply 1 application topically daily.    Allergies  Allergen Reactions  . Atorvastatin     REACTION: pt states intol to LIPITOR in past  . Simvastatin     REACTION: pt states intol to ZOCOR in past    Current Medications, Allergies, Past Medical History, Past Surgical History, Family History, and Social History were reviewed in [employer] record.   Review of Systems         The patient complains of rash.  The patient denies fever, chills, sweats, anorexia, fatigue, weakness, malaise, weight loss, sleep disorder, blurring, diplopia, eye irritation, eye discharge, vision loss, eye pain, photophobia, earache, ear discharge, tinnitus, decreased hearing, nasal congestion, nosebleeds, sore throat, hoarseness, chest pain, palpitations, syncope, dyspnea on exertion, orthopnea, PND, peripheral edema, cough, dyspnea at rest, excessive sputum, hemoptysis, wheezing, pleurisy, nausea, vomiting, diarrhea, constipation, change in bowel habits, abdominal pain, melena, hematochezia, jaundice, gas/bloating, indigestion/heartburn, dysphagia, odynophagia, dysuria, hematuria, urinary frequency, urinary hesitancy, nocturia, incontinence, back pain, joint pain, joint swelling,  muscle cramps, muscle weakness, stiffness, arthritis, sciatica, restless legs, leg pain at night, leg pain with exertion, itching, dryness, suspicious lesions, paralysis, paresthesias, seizures, tremors, vertigo, transient blindness, frequent falls, frequent headaches, difficulty walking, depression, anxiety, memory loss, confusion, cold intolerance, heat intolerance, polydipsia, polyphagia, polyuria, unusual weight change, abnormal bruising, bleeding, enlarged lymph nodes, urticaria, allergic rash, hay fever, and recurrent infections.     Objective:   Physical Exam    WD, WN, 48 y/o WM in NAD... GENERAL:  Alert & oriented; pleasant & cooperative. HEENT:  [place]/AT, EOM-wnl, PERRLA, Fundi-benign, EACs-clear, TMs-wnl, NOSE-clear, THROAT-clear & wnl. NECK:  Supple w/ full ROM; no JVD; normal carotid impulses w/o bruits; no thyromegaly or nodules palpated; no lymphadenopathy. CHEST:  Clear to P & A; without wheezes/ rales/ or rhonchi. HEART:  Regular Rhythm; without murmurs/ rubs/ or gallops. ABDOMEN:  Soft & nontender; normal bowel sounds; no organomegaly or masses detected. RECTAL:  Neg - prostate 2+ & nontender w/o nodules; stool hematest neg. EXT: without deformities or arthritic changes; no varicose veins/ venous insuffic/ or edema. NEURO:  CN's intact; motor testing normal; sensory testing normal; gait normal & balance OK. DERM:  No lesions noted; +acne rosacea...  RADIOLOGY DATA:  Reviewed in the EPIC EMR & discussed w/ the patient...  LABORATORY DATA:  Reviewed in the EPIC EMR & discussed w/ the patient...      Assessment:     Physical Exam>>   Hyperlipid>  Controlled on Prav40, continue same + diet efforts...  Hx subclinical Hyperthy, now wnl>  He remains biochem & clinically euthyroid...  Hx Hems>  Aware, rectal exam neg, stool heme neg...  Beer Drinker>  Aware, rec to cutback, LFTs remain wnl...  DJD>  Aware, uses ankle brace per DrPCarter & OTC analgesics...  Hx Allergy to  Seafood>  Aware, stays ok even w/ oysters as long as he only eats a few...     Plan:     Patient's Medications  New Prescriptions   No medications on file  Previous Medications   ACETAMINOPHEN (TYLENOL) 500 MG TABLET    Take 500 mg by mouth every 6 (six) hours as needed.   AZELAIC ACID 15 % CREAM    Apply 1 application topically 2 (two) times daily. After skin is thoroughly washed and patted dry, gently but thoroughly massage a thin film of azelaic acid cream into the affected area twice daily, in the morning and evening.   NAPROXEN SODIUM (ANAPROX) 220 MG TABLET    Take 220 mg by mouth daily.  Modified Medications   Modified Medication Previous Medication   PRAVASTATIN (PRAVACHOL) 40 MG TABLET pravastatin (PRAVACHOL) 40 MG tablet      TAKE 1 TABLET EVERY DAY    Take 1 tablet (40 mg total) by mouth daily.  Discontinued Medications   METRONIDAZOLE (METROGEL) 1 % GEL    Apply 1 application topically daily.

## 2014-01-24 ENCOUNTER — Other Ambulatory Visit (INDEPENDENT_AMBULATORY_CARE_PROVIDER_SITE_OTHER): Payer: BC Managed Care – PPO

## 2014-01-24 DIAGNOSIS — Z0000 Encounter for general adult medical examination without abnormal findings: Secondary | ICD-10-CM

## 2014-01-24 LAB — CBC WITH DIFFERENTIAL/PLATELET
Basophils Absolute: 0 10*3/uL (ref 0.0–0.1)
Basophils Relative: 0.8 % (ref 0.0–3.0)
Eosinophils Absolute: 0.3 10*3/uL (ref 0.0–0.7)
Eosinophils Relative: 5.1 % — ABNORMAL HIGH (ref 0.0–5.0)
HCT: 46.1 % (ref 39.0–52.0)
Hemoglobin: 15.3 g/dL (ref 13.0–17.0)
Lymphocytes Relative: 37.1 % (ref 12.0–46.0)
Lymphs Abs: 2.1 10*3/uL (ref 0.7–4.0)
MCHC: 33.2 g/dL (ref 30.0–36.0)
MCV: 107.5 fl — ABNORMAL HIGH (ref 78.0–100.0)
Monocytes Absolute: 0.5 10*3/uL (ref 0.1–1.0)
Monocytes Relative: 9.6 % (ref 3.0–12.0)
Neutro Abs: 2.7 10*3/uL (ref 1.4–7.7)
Neutrophils Relative %: 47.4 % (ref 43.0–77.0)
Platelets: 346 10*3/uL (ref 150.0–400.0)
RBC: 4.28 Mil/uL (ref 4.22–5.81)
RDW: 13 % (ref 11.5–14.6)
WBC: 5.7 10*3/uL (ref 4.5–10.5)

## 2014-01-24 LAB — BASIC METABOLIC PANEL
BUN: 17 mg/dL (ref 6–23)
CO2: 28 mEq/L (ref 19–32)
Calcium: 9.3 mg/dL (ref 8.4–10.5)
Chloride: 104 mEq/L (ref 96–112)
Creatinine, Ser: 1 mg/dL (ref 0.4–1.5)
GFR: 86.68 mL/min (ref >60.00)
Glucose, Bld: 84 mg/dL (ref 70–99)
Potassium: 5.1 mEq/L (ref 3.5–5.1)
SODIUM: 139 meq/L (ref 135–145)

## 2014-01-24 LAB — LIPID PANEL
CHOLESTEROL: 194 mg/dL (ref 0–200)
HDL: 78.4 mg/dL (ref >39.00)
LDL CALC: 88 mg/dL (ref 0–99)
Total CHOL/HDL Ratio: 2
Triglycerides: 140 mg/dL (ref 0.0–149.0)
VLDL: 28 mg/dL (ref 0.0–40.0)

## 2014-01-24 LAB — HEPATIC FUNCTION PANEL
ALT: 29 U/L (ref 0–53)
AST: 40 U/L — ABNORMAL HIGH (ref 0–37)
Albumin: 3.9 g/dL (ref 3.5–5.2)
Alkaline Phosphatase: 83 U/L (ref 39–117)
BILIRUBIN DIRECT: 0.1 mg/dL (ref 0.0–0.3)
BILIRUBIN TOTAL: 0.6 mg/dL (ref 0.3–1.2)
Total Protein: 7.4 g/dL (ref 6.0–8.3)

## 2014-01-24 LAB — TSH: TSH: 1.55 u[IU]/mL (ref 0.35–5.50)

## 2014-01-24 LAB — PSA: PSA: 0.32 ng/mL (ref 0.10–4.00)

## 2014-01-28 ENCOUNTER — Telehealth: Payer: Self-pay | Admitting: Pulmonary Disease

## 2014-01-28 DIAGNOSIS — F1010 Alcohol abuse, uncomplicated: Secondary | ICD-10-CM

## 2014-01-28 NOTE — Telephone Encounter (Signed)
Notes Recorded by Brand Males, MD on 01/21/2014 at 6:07 PM No ILD. Let him know there is coronary artery calcification: and if no stress test in a past few years, refer cards  Spoke with the pt and notified of results/recs per SN He verbalized understanding  Lab orders placed

## 2014-01-28 NOTE — Progress Notes (Signed)
Quick Note:  Pt aware See PN dated 01/28/14   ______

## 2014-02-03 ENCOUNTER — Other Ambulatory Visit (INDEPENDENT_AMBULATORY_CARE_PROVIDER_SITE_OTHER): Payer: BC Managed Care – PPO

## 2014-02-03 DIAGNOSIS — F1010 Alcohol abuse, uncomplicated: Secondary | ICD-10-CM

## 2014-02-03 LAB — VITAMIN B12: VITAMIN B 12: 312 pg/mL (ref 211–911)

## 2014-02-03 LAB — FOLATE: Folate: 6.4 ng/mL (ref >5.9)

## 2014-02-10 ENCOUNTER — Telehealth: Payer: Self-pay | Admitting: Pulmonary Disease

## 2014-02-10 NOTE — Telephone Encounter (Signed)
Result Note     Please notify patient>     B12 & Folate levels are low end of normal... I Rec a supplement regimen, and decr the Etoh/ beer...    Rec> OTC MVI, B12 1074mcg/d, Folate 0.8mg /d... or Rx for one CEREFOLIN tab daily...    I spoke with patient about results and he verbalized understanding and had no questions. He wants to hold off on RX right now.

## 2014-06-28 ENCOUNTER — Other Ambulatory Visit: Payer: Self-pay | Admitting: Pulmonary Disease

## 2014-11-30 ENCOUNTER — Other Ambulatory Visit: Payer: Self-pay | Admitting: Pulmonary Disease

## 2015-12-17 ENCOUNTER — Encounter (HOSPITAL_COMMUNITY): Payer: Self-pay | Admitting: Emergency Medicine

## 2015-12-17 ENCOUNTER — Emergency Department (HOSPITAL_COMMUNITY)
Admission: EM | Admit: 2015-12-17 | Discharge: 2015-12-17 | Disposition: A | Discharge status: Home / Self Care | Payer: 59 | Attending: Emergency Medicine | Admitting: Emergency Medicine

## 2015-12-17 ENCOUNTER — Emergency Department (HOSPITAL_COMMUNITY): Payer: 59

## 2015-12-17 DIAGNOSIS — Y9389 Activity, other specified: Secondary | ICD-10-CM | POA: Insufficient documentation

## 2015-12-17 DIAGNOSIS — Y9289 Other specified places as the place of occurrence of the external cause: Secondary | ICD-10-CM | POA: Diagnosis not present

## 2015-12-17 DIAGNOSIS — Z791 Long term (current) use of non-steroidal anti-inflammatories (NSAID): Secondary | ICD-10-CM | POA: Insufficient documentation

## 2015-12-17 DIAGNOSIS — S61012A Laceration without foreign body of left thumb without damage to nail, initial encounter: Secondary | ICD-10-CM | POA: Diagnosis not present

## 2015-12-17 DIAGNOSIS — Y998 Other external cause status: Secondary | ICD-10-CM | POA: Insufficient documentation

## 2015-12-17 DIAGNOSIS — Z79899 Other long term (current) drug therapy: Secondary | ICD-10-CM | POA: Diagnosis not present

## 2015-12-17 DIAGNOSIS — Z8709 Personal history of other diseases of the respiratory system: Secondary | ICD-10-CM | POA: Diagnosis not present

## 2015-12-17 DIAGNOSIS — W260XXA Contact with knife, initial encounter: Secondary | ICD-10-CM | POA: Insufficient documentation

## 2015-12-17 DIAGNOSIS — Z23 Encounter for immunization: Secondary | ICD-10-CM | POA: Diagnosis not present

## 2015-12-17 DIAGNOSIS — Z8719 Personal history of other diseases of the digestive system: Secondary | ICD-10-CM | POA: Diagnosis not present

## 2015-12-17 DIAGNOSIS — E785 Hyperlipidemia, unspecified: Secondary | ICD-10-CM | POA: Diagnosis not present

## 2015-12-17 DIAGNOSIS — M1990 Unspecified osteoarthritis, unspecified site: Secondary | ICD-10-CM | POA: Diagnosis not present

## 2015-12-17 DIAGNOSIS — Z872 Personal history of diseases of the skin and subcutaneous tissue: Secondary | ICD-10-CM | POA: Diagnosis not present

## 2015-12-17 DIAGNOSIS — S6992XA Unspecified injury of left wrist, hand and finger(s), initial encounter: Secondary | ICD-10-CM | POA: Diagnosis present

## 2015-12-17 MED ORDER — TETANUS-DIPHTH-ACELL PERTUSSIS 5-2.5-18.5 LF-MCG/0.5 IM SUSP
0.5000 mL | Freq: Once | INTRAMUSCULAR | Status: AC
  Administered 2015-12-17: 0.5 mL via INTRAMUSCULAR
  Filled 2015-12-17: qty 0.5

## 2015-12-17 MED ORDER — LIDOCAINE HCL (PF) 1 % IJ SOLN
5.0000 mL | Freq: Once | INTRAMUSCULAR | Status: AC
  Administered 2015-12-17: 5 mL
  Filled 2015-12-17: qty 5

## 2015-12-17 NOTE — ED Provider Notes (Signed)
History  By signing my name below, I, Marlowe Kays, attest that this documentation has been prepared under the direction and in the presence of Graziella Connery, PA-C. Electronically Signed: Marlowe Kays, ED Scribe. 12/17/2015. 1:17 PM.  Chief Complaint  Patient presents with  . Extremity Laceration   The history is provided by the patient and medical records. No language interpreter was used.    HPI Comments:  Daniel Mercer is a 50 y.o. male who presents to the Emergency Department complaining of a laceration to the dorsal left thumb that occurred approximately 1 hour ago while working with a utility knife doing floor work. He reports associated bleeding that has since been controlled. He reports increased soreness of the area since the injury occurred. He has not taken anything for pain but has wrapped the area with a paper towel. He denies modifying factors. He denies numbness, tingling or weakness of the left thumb or hand. He is unsure of his last tetanus vaccination. He is left-hand dominant. He denies anticoagulant therapy. He reports taking one Aleve daily.  Past Medical History  Diagnosis Date  . Sinusitis   . Hyperlipidemia   . Hyperthyroidism   . Hemorrhoids   . DJD (degenerative joint disease)   . Acne rosacea   . Personal history of allergy to seafood    Past Surgical History  Procedure Laterality Date  . Bilateral inguinal hernia repairs      left in 1997, right in 2003   No family history on file. Social History  Substance Use Topics  . Smoking status: Never Smoker   . Smokeless tobacco: None  . Alcohol Use: Yes     Comment: drinks beer regularly    Review of Systems  Constitutional: Negative for fever.  Musculoskeletal: Negative for myalgias and arthralgias.  Skin: Positive for wound.  Allergic/Immunologic: Negative for immunocompromised state.  Neurological: Negative for weakness and numbness.  Hematological: Does not bruise/bleed easily.   Psychiatric/Behavioral: Positive for self-injury (accidental ).    Allergies  Atorvastatin and Simvastatin  Home Medications   Prior to Admission medications   Medication Sig Start Date End Date Taking? Authorizing Provider  acetaminophen (TYLENOL) 500 MG tablet Take 500 mg by mouth every 6 (six) hours as needed.    Historical Provider, MD  Azelaic Acid 15 % cream Apply 1 application topically 2 (two) times daily. After skin is thoroughly washed and patted dry, gently but thoroughly massage a thin film of azelaic acid cream into the affected area twice daily, in the morning and evening.    Historical Provider, MD  naproxen sodium (ANAPROX) 220 MG tablet Take 220 mg by mouth daily.    Historical Provider, MD  pravastatin (PRAVACHOL) 40 MG tablet TAKE 1 TABLET BY MOUTH ONCE DAILY 11/30/14   Noralee Space, MD   Triage Vitals: BP 131/92 mmHg  Pulse 60  Temp(Src) 98.7 F (37.1 C) (Oral)  Resp 18  SpO2 100% Physical Exam  Constitutional: He appears well-developed and well-nourished. No distress.  HENT:  Head: Normocephalic and atraumatic.  Neck: Neck supple.  Pulmonary/Chest: Effort normal.  Neurological: He is alert.  Skin: He is not diaphoretic.  Left hand with 2 cm laceration over dorsal left thumb. Hemostatic. Full active range of motion of all digits, strength 5/5, sensation intact, capillary refill < 2 seconds.  Nursing note and vitals reviewed.   ED Course  Procedures (including critical care time) DIAGNOSTIC STUDIES: Oxygen Saturation is 100% on RA, normal by my interpretation.  COORDINATION OF CARE: 1:15 PM- Will update tetanus vaccination and suture wound. Pt verbalizes understanding and agrees to plan.  LACERATION REPAIR PROCEDURE NOTE The patient's identification was confirmed and consent was obtained. This procedure was performed by Clayton Bibles, PA-C at 1:18 PM. Site: dorsal left thumb Sterile procedures observed Anesthetic used (type and amt): Lidocaine 1%  without Epinephrine (4 mLs) Suture type/size: ethilon 4-0 Length: 2 cm # of Sutures: 4 Technique: simple, interrupted Complexity: simple Tetanus ordered Site anesthetized, irrigated with NS, explored without evidence of foreign body, wound well approximated, dressed by nurse.  Patient tolerated procedure well without complications. Instructions for care discussed verbally and patient provided with additional written instructions for homecare and f/u.   Medications  lidocaine (PF) (XYLOCAINE) 1 % injection 5 mL (not administered)  Tdap (BOOSTRIX) injection 0.5 mL (not administered)   Imaging Review No results found. I have personally reviewed and evaluated these images and lab results as part of my medical decision-making.   MDM   Final diagnoses:  Thumb laceration, left, initial encounter    Afebrile, nontoxic patient with accidental laceration to dorsal left thumb.  No tendon injury.  Neurovascularly intact.  Repaired in ED.   D/C home with aluminum finger splint, wound care instructions.  Discussed result, findings, treatment, and follow up  with patient.  Pt given return precautions.  Pt verbalizes understanding and agrees with plan.       I personally performed the services described in this documentation, which was scribed in my presence. The recorded information has been reviewed and is accurate.     Clayton Bibles, PA-C 12/17/15 [place], MD 12/18/15 [phone]

## 2015-12-17 NOTE — Discharge Instructions (Signed)
°Read the information below.  You may return to the Emergency Department at any time for worsening condition or any new symptoms that concern you.  If you develop redness, swelling, pus draining from the wound, or fevers greater than 100.4, return to the ER immediately for a recheck.   ° °Laceration Care, Adult °A laceration is a cut that goes through all of the layers of the skin and into the tissue that is right under the skin. Some lacerations heal on their own. Others need to be closed with stitches (sutures), staples, skin adhesive strips, or skin glue. Proper laceration care minimizes the risk of infection and helps the laceration to heal better. °HOW TO CARE FOR YOUR LACERATION °If sutures or staples were used: °· Keep the wound clean and dry. °· If you were given a bandage (dressing), you should change it at least one time per day or as told by your health care provider. You should also change it if it becomes wet or dirty. °· Keep the wound completely dry for the first 24 hours or as told by your health care provider. After that time, you may shower or bathe. However, make sure that the wound is not soaked in water until after the sutures or staples have been removed. °· Clean the wound one time each day or as told by your health care provider: °¨ Wash the wound with soap and water. °¨ Rinse the wound with water to remove all soap. °¨ Pat the wound dry with a clean towel. Do not rub the wound. °· After cleaning the wound, apply a thin layer of antibiotic ointment as told by your health care provider. This will help to prevent infection and keep the dressing from sticking to the wound. °· Have the sutures or staples removed as told by your health care provider. °If skin adhesive strips were used: °· Keep the wound clean and dry. °· If you were given a bandage (dressing), you should change it at least one time per day or as told by your health care provider. You should also change it if it becomes dirty or  wet. °· Do not get the skin adhesive strips wet. You may shower or bathe, but be careful to keep the wound dry. °· If the wound gets wet, pat it dry with a clean towel. Do not rub the wound. °· Skin adhesive strips fall off on their own. You may trim the strips as the wound heals. Do not remove skin adhesive strips that are still stuck to the wound. They will fall off in time. °If skin glue was used: °· Try to keep the wound dry, but you may briefly wet it in the shower or bath. Do not soak the wound in water, such as by swimming. °· After you have showered or bathed, gently pat the wound dry with a clean towel. Do not rub the wound. °· Do not do any activities that will make you sweat heavily until the skin glue has fallen off on its own. °· Do not apply liquid, cream, or ointment medicine to the wound while the skin glue is in place. Using those may loosen the film before the wound has healed. °· If you were given a bandage (dressing), you should change it at least one time per day or as told by your health care provider. You should also change it if it becomes dirty or wet. °· If a dressing is placed over the wound, be careful not to apply   tape directly over the skin glue. Doing that may cause the glue to be pulled off before the wound has healed. °· Do not pick at the glue. The skin glue usually remains in place for 5-10 days, then it falls off of the skin. °General Instructions °· Take over-the-counter and prescription medicines only as told by your health care provider. °· If you were prescribed an antibiotic medicine or ointment, take or apply it as told by your doctor. Do not stop using it even if your condition improves. °· To help prevent scarring, make sure to cover your wound with sunscreen whenever you are outside after stitches are removed, after adhesive strips are removed, or when glue remains in place and the wound is healed. Make sure to wear a sunscreen of at least 30 SPF. °· Do not scratch or  pick at the wound. °· Keep all follow-up visits as told by your health care provider. This is important. °· Check your wound every day for signs of infection. Watch for: °¨ Redness, swelling, or pain. °¨ Fluid, blood, or pus. °· Raise (elevate) the injured area above the level of your heart while you are sitting or lying down, if possible. °SEEK MEDICAL CARE IF: °· You received a tetanus shot and you have swelling, severe pain, redness, or bleeding at the injection site. °· You have a fever. °· A wound that was closed breaks open. °· You notice a bad smell coming from your wound or your dressing. °· You notice something coming out of the wound, such as wood or glass. °· Your pain is not controlled with medicine. °· You have increased redness, swelling, or pain at the site of your wound. °· You have fluid, blood, or pus coming from your wound. °· You notice a change in the color of your skin near your wound. °· You need to change the dressing frequently due to fluid, blood, or pus draining from the wound. °· You develop a new rash. °· You develop numbness around the wound. °SEEK IMMEDIATE MEDICAL CARE IF: °· You develop severe swelling around the wound. °· Your pain suddenly increases and is severe. °· You develop painful lumps near the wound or on skin that is anywhere on your body. °· You have a red streak going away from your wound. °· The wound is on your hand or foot and you cannot properly move a finger or toe. °· The wound is on your hand or foot and you notice that your fingers or toes look pale or bluish. °  °This information is not intended to replace advice given to you by your health care provider. Make sure you discuss any questions you have with your health care provider. °  °Document Released: 11/18/2005 Document Revised: 04/04/2015 Document Reviewed: 11/14/2014 °Elsevier Interactive Patient Education ©2016 [employer] ° °

## 2015-12-17 NOTE — Progress Notes (Addendum)
Pt cut the left thumb on the top region with a utility knife. Laceration is about  3cm. Pressure applied to stop the bleeding. PA in with the pt. Pt has range of motion and does have sensation in his finger. No numbness or tingling noted. Family is at the bedside. Pt does not think his tetanus is up to date. Tetanus shot given in the right deltoid. Pt tolerated well.Bacitracin apllied with a dressing. Tech, John applied a finger splint.

## 2015-12-17 NOTE — ED Notes (Signed)
Pt states he was working on some flooring with his utility knife when he cut the top of his lt thumb.  Does not want this nurse to take off the bandage because he "usually passes out".  Will allow provider to assess wound.
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
## 2018-01-08 DIAGNOSIS — L814 Other melanin hyperpigmentation: Secondary | ICD-10-CM | POA: Diagnosis not present

## 2018-01-08 DIAGNOSIS — D225 Melanocytic nevi of trunk: Secondary | ICD-10-CM | POA: Diagnosis not present

## 2018-01-08 DIAGNOSIS — Z86018 Personal history of other benign neoplasm: Secondary | ICD-10-CM | POA: Diagnosis not present

## 2018-01-08 DIAGNOSIS — D1801 Hemangioma of skin and subcutaneous tissue: Secondary | ICD-10-CM | POA: Diagnosis not present

## 2018-01-08 DIAGNOSIS — L570 Actinic keratosis: Secondary | ICD-10-CM | POA: Diagnosis not present

## 2018-08-21 DIAGNOSIS — Z125 Encounter for screening for malignant neoplasm of prostate: Secondary | ICD-10-CM | POA: Diagnosis not present

## 2018-08-21 DIAGNOSIS — Z0000 Encounter for general adult medical examination without abnormal findings: Secondary | ICD-10-CM | POA: Diagnosis not present

## 2018-08-21 DIAGNOSIS — E7849 Other hyperlipidemia: Secondary | ICD-10-CM | POA: Diagnosis not present

## 2018-08-25 DIAGNOSIS — I10 Essential (primary) hypertension: Secondary | ICD-10-CM | POA: Diagnosis not present

## 2018-08-25 DIAGNOSIS — Z1389 Encounter for screening for other disorder: Secondary | ICD-10-CM | POA: Diagnosis not present

## 2018-08-25 DIAGNOSIS — E7849 Other hyperlipidemia: Secondary | ICD-10-CM | POA: Diagnosis not present

## 2018-08-25 DIAGNOSIS — I493 Ventricular premature depolarization: Secondary | ICD-10-CM | POA: Diagnosis not present

## 2018-08-25 DIAGNOSIS — F1920 Other psychoactive substance dependence, uncomplicated: Secondary | ICD-10-CM | POA: Diagnosis not present

## 2018-08-25 DIAGNOSIS — Z0000 Encounter for general adult medical examination without abnormal findings: Secondary | ICD-10-CM | POA: Diagnosis not present

## 2018-09-14 DIAGNOSIS — Z1212 Encounter for screening for malignant neoplasm of rectum: Secondary | ICD-10-CM | POA: Diagnosis not present

## 2018-09-14 DIAGNOSIS — Z1211 Encounter for screening for malignant neoplasm of colon: Secondary | ICD-10-CM | POA: Diagnosis not present

## 2018-11-17 ENCOUNTER — Encounter: Payer: Self-pay | Admitting: Internal Medicine

## 2018-12-14 ENCOUNTER — Ambulatory Visit (AMBULATORY_SURGERY_CENTER): Payer: Self-pay | Admitting: *Deleted

## 2018-12-14 VITALS — Ht 74.0 in | Wt 202.0 lb

## 2018-12-14 DIAGNOSIS — Z1211 Encounter for screening for malignant neoplasm of colon: Secondary | ICD-10-CM

## 2018-12-14 MED ORDER — NA SULFATE-K SULFATE-MG SULF 17.5-3.13-1.6 GM/177ML PO SOLN: ORAL | 0 refills | Status: DC

## 2018-12-14 NOTE — Progress Notes (Signed)
Patient denies any allergies to eggs or soy. Patient denies any problems with anesthesia/sedation. Patient denies any oxygen use at home. Patient denies taking any diet/weight loss medications or blood thinners. EMMI education assisgned to patient on colonoscopy, this was explained and instructions given to patient. Suprep coupon printed and given to pt. Patient was verbally instructed NOT to chew tobacco after Midnight on Sunday, none at all on Monday or his procedure will be cancelled, pt verbalizes understanding.

## 2018-12-15 ENCOUNTER — Encounter: Payer: Self-pay | Admitting: Internal Medicine

## 2018-12-28 ENCOUNTER — Encounter: Payer: Self-pay | Admitting: Internal Medicine

## 2018-12-28 ENCOUNTER — Ambulatory Visit (AMBULATORY_SURGERY_CENTER): Payer: BLUE CROSS/BLUE SHIELD | Admitting: Internal Medicine

## 2018-12-28 VITALS — BP 154/94 | HR 70 | Temp 98.7°F | Resp 13 | Ht 74.0 in | Wt 226.0 lb

## 2018-12-28 DIAGNOSIS — Z1211 Encounter for screening for malignant neoplasm of colon: Secondary | ICD-10-CM

## 2018-12-28 DIAGNOSIS — K635 Polyp of colon: Secondary | ICD-10-CM

## 2018-12-28 DIAGNOSIS — D125 Benign neoplasm of sigmoid colon: Secondary | ICD-10-CM

## 2018-12-28 MED ORDER — SODIUM CHLORIDE 0.9 % IV SOLN
500.0000 mL | Freq: Once | INTRAVENOUS | Status: DC
Start: 2018-12-28 — End: 2021-08-14

## 2018-12-28 NOTE — Progress Notes (Signed)
Called to room to assist during endoscopic procedure.  Patient ID and intended procedure confirmed with present staff. Received instructions for my participation in the procedure from the performing physician.  

## 2018-12-28 NOTE — Patient Instructions (Signed)
Impression/Recommendations:  Polyp handout given to patient. Diverticulosis handout given to patient. Hemorrhoid handout given to patient.  Repeat colonoscopy in 5 years for surveillance.  Resume previous diet. Continue present medications.  Await pathology report.  YOU HAD AN ENDOSCOPIC PROCEDURE TODAY AT [facility]:   Refer to the procedure report that was given to you for any specific questions about what was found during the examination.  If the procedure report does not answer your questions, please call your gastroenterologist to clarify.  If you requested that your care partner not be given the details of your procedure findings, then the procedure report has been included in a sealed envelope for you to review at your convenience later.  YOU SHOULD EXPECT: Some feelings of bloating in the abdomen. Passage of more gas than usual.  Walking can help get rid of the air that was put into your GI tract during the procedure and reduce the bloating. If you had a lower endoscopy (such as a colonoscopy or flexible sigmoidoscopy) you may notice spotting of blood in your stool or on the toilet paper. If you underwent a bowel prep for your procedure, you may not have a normal bowel movement for a few days.  Please Note:  You might notice some irritation and congestion in your nose or some drainage.  This is from the oxygen used during your procedure.  There is no need for concern and it should clear up in a day or so.  SYMPTOMS TO REPORT IMMEDIATELY:   Following lower endoscopy (colonoscopy or flexible sigmoidoscopy):  Excessive amounts of blood in the stool  Significant tenderness or worsening of abdominal pains  Swelling of the abdomen that is new, acute  Fever of 100F or higher For urgent or emergent issues, a gastroenterologist can be reached at any hour by calling [phone].   DIET:  We do recommend a small meal at first, but then you may proceed to your regular  diet.  Drink plenty of fluids but you should avoid alcoholic beverages for 24 hours.  ACTIVITY:  You should plan to take it easy for the rest of today and you should NOT DRIVE or use heavy machinery until tomorrow (because of the sedation medicines used during the test).    FOLLOW UP: Our staff will call the number listed on your records the next business day following your procedure to check on you and address any questions or concerns that you may have regarding the information given to you following your procedure. If we do not reach you, we will leave a message.  However, if you are feeling well and you are not experiencing any problems, there is no need to return our call.  We will assume that you have returned to your regular daily activities without incident.  If any biopsies were taken you will be contacted by phone or by letter within the next 1-3 weeks.  Please call us at [phone] if you have not heard about the biopsies in 3 weeks.    SIGNATURES/CONFIDENTIALITY: You and/or your care partner have signed paperwork which will be entered into your electronic medical record.  These signatures attest to the fact that that the information above on your After Visit Summary has been reviewed and is understood.  Full responsibility of the confidentiality of this discharge information lies with you and/or your care-partner.

## 2018-12-28 NOTE — Progress Notes (Signed)
I have reviewed the patient's medical history in detail and updated the computerized patient record.

## 2018-12-28 NOTE — Progress Notes (Signed)
Report to PACU, RN, vss, BBS= Clear.  

## 2018-12-28 NOTE — Op Note (Signed)
[facility] Patient Name: Daniel Mercer Procedure Date: 12/28/2018 10:23 AM MRN: [id number] Endoscopist: Docia Chuck. Henrene Pastor , MD Age: 53 Referring MD:  Date of Birth: 09/14/1966 Gender: Male Account #: [id number] Procedure:                Colonoscopy hot snare polypectomy x 1 Indications:              Screening for colorectal malignant neoplasm Medicines:                Monitored Anesthesia Care Procedure:                Pre-Anesthesia Assessment:                           - Prior to the procedure, a History and Physical                            was performed, and patient medications and                            allergies were reviewed. The patient's tolerance of                            previous anesthesia was also reviewed. The risks                            and benefits of the procedure and the sedation                            options and risks were discussed with the patient.                            All questions were answered, and informed consent                            was obtained. Prior Anticoagulants: The patient has                            taken no previous anticoagulant or antiplatelet                            agents. ASA Grade Assessment: II - A patient with                            mild systemic disease. After reviewing the risks                            and benefits, the patient was deemed in                            satisfactory condition to undergo the procedure.                           After obtaining informed consent, the colonoscope  was passed under direct vision. Throughout the                            procedure, the patient's blood pressure, pulse, and                            oxygen saturations were monitored continuously. The                            Colonoscope was introduced through the anus and                            advanced to the the cecum, identified by   appendiceal orifice and ileocecal valve. The                            ileocecal valve, appendiceal orifice, and rectum                            were photographed. The quality of the bowel                            preparation was excellent. The colonoscopy was                            performed without difficulty. The patient tolerated                            the procedure well. The bowel preparation used was                            SUPREP. Scope In: 10:33:25 AM Scope Out: 10:46:03 AM Scope Withdrawal Time: 0 hours 6 minutes 46 seconds  Total Procedure Duration: 0 hours 12 minutes 38 seconds  Findings:                 A 8 mm polyp was found in the sigmoid colon. The                            polyp was pedunculated. The polyp was removed with                            a hot snare. Resection and retrieval were complete.                           Multiple diverticula were found in the entire colon.                           Internal hemorrhoids were found during retroflexion.                           The exam was otherwise without abnormality on                            direct and  retroflexion views. Complications:            No immediate complications. Estimated blood loss:                            None. Estimated Blood Loss:     Estimated blood loss: none. Impression:               - One 8 mm polyp in the sigmoid colon, removed with                            a hot snare. Resected and retrieved.                           - Diverticulosis in the entire examined colon.                           - Internal hemorrhoids.                           - The examination was otherwise normal on direct                            and retroflexion views. Recommendation:           - Repeat colonoscopy in 5 years for surveillance.                           - Patient has a contact number available for                            emergencies. The signs and symptoms of potential                             delayed complications were discussed with the                            patient. Return to normal activities tomorrow.                            Written discharge instructions were provided to the                            patient.                           - Resume previous diet.                           - Continue present medications.                           - Await pathology results. Docia Chuck. Henrene Pastor, MD 12/28/2018 10:51:13 AM This report has been signed electronically.

## 2018-12-29 ENCOUNTER — Telehealth: Payer: Self-pay | Admitting: *Deleted

## 2018-12-29 NOTE — Telephone Encounter (Signed)
  Follow up Call-  Call back number 12/28/2018  Post procedure Call Back phone  # [phone]  Permission to leave phone message Yes  Some recent data might be hidden     Patient questions:  Do you have a fever, pain , or abdominal swelling? No. Pain Score  0 *  Have you tolerated food without any problems? Yes.    Have you been able to return to your normal activities? Yes.    Do you have any questions about your discharge instructions: Diet   No. Medications  No. Follow up visit  No.  Do you have questions or concerns about your Care? No.  Actions: * If pain score is 4 or above: No action needed, pain <4.

## 2018-12-31 ENCOUNTER — Encounter: Payer: Self-pay | Admitting: Internal Medicine

## 2019-01-19 DIAGNOSIS — Z86018 Personal history of other benign neoplasm: Secondary | ICD-10-CM | POA: Diagnosis not present

## 2019-01-19 DIAGNOSIS — L814 Other melanin hyperpigmentation: Secondary | ICD-10-CM | POA: Diagnosis not present

## 2019-01-19 DIAGNOSIS — Z23 Encounter for immunization: Secondary | ICD-10-CM | POA: Diagnosis not present

## 2019-01-19 DIAGNOSIS — D225 Melanocytic nevi of trunk: Secondary | ICD-10-CM | POA: Diagnosis not present

## 2019-08-25 DIAGNOSIS — Z0000 Encounter for general adult medical examination without abnormal findings: Secondary | ICD-10-CM | POA: Diagnosis not present

## 2019-08-25 DIAGNOSIS — Z125 Encounter for screening for malignant neoplasm of prostate: Secondary | ICD-10-CM | POA: Diagnosis not present

## 2019-08-25 DIAGNOSIS — I10 Essential (primary) hypertension: Secondary | ICD-10-CM | POA: Diagnosis not present

## 2019-08-27 DIAGNOSIS — R82998 Other abnormal findings in urine: Secondary | ICD-10-CM | POA: Diagnosis not present

## 2019-08-27 DIAGNOSIS — I10 Essential (primary) hypertension: Secondary | ICD-10-CM | POA: Diagnosis not present

## 2019-09-01 DIAGNOSIS — Z1331 Encounter for screening for depression: Secondary | ICD-10-CM | POA: Diagnosis not present

## 2019-09-01 DIAGNOSIS — Z0000 Encounter for general adult medical examination without abnormal findings: Secondary | ICD-10-CM | POA: Diagnosis not present

## 2019-09-01 DIAGNOSIS — E785 Hyperlipidemia, unspecified: Secondary | ICD-10-CM | POA: Diagnosis not present

## 2019-09-01 DIAGNOSIS — I10 Essential (primary) hypertension: Secondary | ICD-10-CM | POA: Diagnosis not present

## 2020-03-16 DIAGNOSIS — L814 Other melanin hyperpigmentation: Secondary | ICD-10-CM | POA: Diagnosis not present

## 2020-03-16 DIAGNOSIS — Z86018 Personal history of other benign neoplasm: Secondary | ICD-10-CM | POA: Diagnosis not present

## 2020-03-16 DIAGNOSIS — L821 Other seborrheic keratosis: Secondary | ICD-10-CM | POA: Diagnosis not present

## 2020-03-16 DIAGNOSIS — D225 Melanocytic nevi of trunk: Secondary | ICD-10-CM | POA: Diagnosis not present

## 2020-09-08 DIAGNOSIS — Z125 Encounter for screening for malignant neoplasm of prostate: Secondary | ICD-10-CM | POA: Diagnosis not present

## 2020-09-08 DIAGNOSIS — E785 Hyperlipidemia, unspecified: Secondary | ICD-10-CM | POA: Diagnosis not present

## 2020-09-08 DIAGNOSIS — Z0000 Encounter for general adult medical examination without abnormal findings: Secondary | ICD-10-CM | POA: Diagnosis not present

## 2021-01-08 ENCOUNTER — Other Ambulatory Visit: Payer: Self-pay

## 2021-01-09 ENCOUNTER — Ambulatory Visit (INDEPENDENT_AMBULATORY_CARE_PROVIDER_SITE_OTHER): Payer: BC Managed Care – PPO | Admitting: Family Medicine

## 2021-01-09 ENCOUNTER — Encounter: Payer: Self-pay | Admitting: Family Medicine

## 2021-01-09 VITALS — BP 140/78 | HR 97 | Temp 97.0°F | Ht 74.0 in | Wt 208.0 lb

## 2021-01-09 DIAGNOSIS — I10 Essential (primary) hypertension: Secondary | ICD-10-CM | POA: Diagnosis not present

## 2021-01-09 DIAGNOSIS — E782 Mixed hyperlipidemia: Secondary | ICD-10-CM | POA: Diagnosis not present

## 2021-01-09 NOTE — Progress Notes (Signed)
[facility] [address] [place] [address] Dept: [phone] Dept Fax: [phone]  New Patient Office Visit  Subjective:    Patient ID: Elinor Dodge, male    DOB: [birth year], 55 y.o..   MRN: [id number]  Chief Complaint  Patient presents with  . Establish Care    New patient, no concerns CPE.     History of Present Illness:  Patient is in today to establish care. He was dissatisfied with repeated rescheduling by his previous clinic.  Mr. Fray has a prior history of lumbar degenerative disc disease. He notes this gave him trouble many years ago, but has since resolved.  Mr. Hoe has a history of acne rosacea. He notes working in the sun has been a problem related to flares. He treis to avoid antibioitcs, esp. As they can cause a photodermatitis. He is currently managed by Dr. Howell Rucks (dermatology).  Mr. Kropf has a history of hyperlipidemia and hypertension. He last had labs completed in the late summer. He had meant to bring these today, but forgot. He is treated with pravastatin and diltiazem. He notes he was told once that he had an irregular heartbeat, but is unsure what the irregularity might have been.  Past Medical History: Patient Active Problem List   Diagnosis Date Noted  . Essential hypertension 01/09/2021  . Physical exam, annual 01/21/2013  . ACNE ROSACEA 01/21/2011  . DEGENERATIVE JOINT DISEASE 10/31/2008  . BEER DRINKER 12/18/2007  . HEMORRHOIDS 12/18/2007  . SINUSITIS 12/18/2007  . PERSONAL HISTORY OF ALLERGY TO SEAFOOD 12/18/2007  . Hyperlipidemia 12/17/2007   Past Surgical History:  Procedure Laterality Date  . bilateral inguinal hernia repairs     left in 1997, right in 2003   Family History  Problem Relation Age of Onset  . Colon polyps Mother   . Colon polyps Father   . Colon polyps Sister   . Colon cancer Neg Hx   . Esophageal cancer Neg Hx   . Rectal cancer Neg Hx   . Stomach  cancer Neg Hx    Outpatient Medications Prior to Visit  Medication Sig Dispense Refill  . diltiazem (CARDIZEM) 120 MG tablet Take 120 mg by mouth daily.    . Multiple Vitamin (MULTIVITAMIN WITH MINERALS) TABS tablet Take 1 tablet by mouth daily.    . naproxen sodium (ANAPROX) 220 MG tablet Take 220 mg by mouth daily.    . pravastatin (PRAVACHOL) 40 MG tablet TAKE 1 TABLET BY MOUTH ONCE DAILY 30 tablet 4  . TIADYLT ER 120 MG 24 hr capsule Take 120 mg by mouth daily. (Patient not taking: Reported on 01/09/2021)     Facility-Administered Medications Prior to Visit  Medication Dose Route Frequency Provider Last Rate Last Admin  . 0.9 %  sodium chloride infusion  500 mL Intravenous Once Irene Shipper, MD        Allergies  Allergen Reactions  . Atorvastatin     REACTION: pt states intol to LIPITOR in past  . Simvastatin     REACTION: pt states intol to ZOCOR in past   Social History   Tobacco Use  . Smoking status: Never Smoker  . Smokeless tobacco: Current User    Types: Snuff  Vaping Use  . Vaping Use: Never used  Substance Use Topics  . Alcohol use: Yes    Alcohol/week: 13.0 standard drinks    Types: 13 Cans of beer per week  . Drug use: Not Currently  Wife died in 2018 from cancer. Currently lives alone. 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  Objective:   Today's Vitals   01/09/21 1038  BP: 140/78  Pulse: 97  Temp: (!) 97 F (36.1 C)  TempSrc: Temporal  SpO2: 98%  Weight: 208 lb (94.3 kg)  Height: 6\' 2"  (1.88 m)   Body mass index is 26.71 kg/m.   General: Well developed, well nourished. No acute distress. HEENT: There are scattered red maculopapular lesions on the nose with mild erythema. Neck: Supple. No lymphadenopathy. No bruits. Lungs: Clear to auscultation bilaterally. CV: RRR without murmurs or rubs. Pulses 2+ bilaterally. Abdomen: Soft, non-tender. No hepatosplenomegaly. No rebound or guarding. Extremities: Full ROM. No joint swelling or  tenderness. No edema noted. Psych: Alert and oriented. Normal mood and affect.  Health Maintenance Due  Topic Date Due  . Hepatitis C Screening  Never done  . HIV Screening  Never done     Assessment & Plan:   1. Essential hypertension Blood pressure is in acceptable control. Continue diltiazem.  2. Mixed hyperlipidemia Patient to send results of last lipid profile via My Chart. Continue pravastatin.   Haydee Salter, MD

## 2021-01-10 ENCOUNTER — Encounter: Payer: Self-pay | Admitting: Family Medicine

## 2021-03-26 DIAGNOSIS — D225 Melanocytic nevi of trunk: Secondary | ICD-10-CM | POA: Diagnosis not present

## 2021-03-26 DIAGNOSIS — Z86018 Personal history of other benign neoplasm: Secondary | ICD-10-CM | POA: Diagnosis not present

## 2021-03-26 DIAGNOSIS — L814 Other melanin hyperpigmentation: Secondary | ICD-10-CM | POA: Diagnosis not present

## 2021-03-26 DIAGNOSIS — L821 Other seborrheic keratosis: Secondary | ICD-10-CM | POA: Diagnosis not present

## 2021-04-09 ENCOUNTER — Ambulatory Visit (INDEPENDENT_AMBULATORY_CARE_PROVIDER_SITE_OTHER): Payer: BC Managed Care – PPO | Admitting: Family Medicine

## 2021-04-09 ENCOUNTER — Other Ambulatory Visit: Payer: Self-pay

## 2021-04-09 ENCOUNTER — Encounter: Payer: Self-pay | Admitting: Family Medicine

## 2021-04-09 VITALS — BP 124/82 | HR 74 | Temp 97.5°F | Ht 74.0 in | Wt 206.8 lb

## 2021-04-09 DIAGNOSIS — E782 Mixed hyperlipidemia: Secondary | ICD-10-CM

## 2021-04-09 DIAGNOSIS — I10 Essential (primary) hypertension: Secondary | ICD-10-CM

## 2021-04-09 DIAGNOSIS — L719 Rosacea, unspecified: Secondary | ICD-10-CM

## 2021-04-09 NOTE — Progress Notes (Signed)
[facility] [address] [place] [address] Dept: [phone] Dept Fax: [phone]  Chronic Care Office Visit  Subjective:    Patient ID: Daniel Mercer, male    DOB: 1966-10-04, 55 y.o..   MRN: [id number]  Chief Complaint  Patient presents with  . Follow-up    2 month fu HTN/chol.   No concerns.      History of Present Illness:  Patient is in today for reassessment of chronic medical issues. Mr. Adduci notes he is doing well at this point. He denies nay problems with his medication.   Mr. Tippin has a history of hypertension, treated with  diltiazem.   He has a history of hyperlipidemia, managed with pravastatin. He last had lipids checked in October. He sent the results to me after his last visit.  M.r Stamas continues to see dermatology related to his rosacea. He notes this is currently doing well and has not noted any flares.   Past Medical History: Patient Active Problem List   Diagnosis Date Noted  . Essential hypertension 01/09/2021  . Acne rosacea 01/21/2011  . DEGENERATIVE JOINT DISEASE 10/31/2008  . Hemorrhoids 12/18/2007  . Hyperlipidemia 12/17/2007   Past Surgical History:  Procedure Laterality Date  . bilateral inguinal hernia repairs     left in 1997, right in 2003   Family History  Problem Relation Age of Onset  . Colon polyps Mother   . Colon polyps Father   . Colon polyps Sister   . Colon cancer Neg Hx   . Esophageal cancer Neg Hx   . Rectal cancer Neg Hx   . Stomach cancer Neg Hx    Outpatient Medications Prior to Visit  Medication Sig Dispense Refill  . diltiazem (CARDIZEM) 120 MG tablet Take 120 mg by mouth daily.    . Multiple Vitamin (MULTIVITAMIN WITH MINERALS) TABS tablet Take 1 tablet by mouth daily.    . naproxen sodium (ANAPROX) 220 MG tablet Take 220 mg by mouth daily.    . pravastatin (PRAVACHOL) 40 MG tablet TAKE 1 TABLET BY MOUTH ONCE DAILY 30 tablet 4    Facility-Administered Medications Prior to Visit  Medication Dose Route Frequency Provider Last Rate Last Admin  . 0.9 %  sodium chloride infusion  500 mL Intravenous Once Irene Shipper, MD       Allergies  Allergen Reactions  . Atorvastatin     REACTION: pt states intol to LIPITOR in past  . Simvastatin     REACTION: pt states intol to ZOCOR in past  . Shellfish Allergy Rash    Objective:   Today's Vitals   04/09/21 0851  BP: 124/82  Pulse: 74  Temp: (!) 97.5 F (36.4 C)  TempSrc: Temporal  SpO2: 98%  Weight: 206 lb 12.8 oz (93.8 kg)  Height: 6\' 2"  (1.88 m)   Body mass index is 26.55 kg/m.   General: Well developed, well nourished. No acute distress. Skin: Warm and dry. Mild redness and telangiectasias over the cheeks and nose with mild enlargement of the nose. No   current pustular lesions. Psych: Alert and oriented. Normal mood and affect.  Health Maintenance Due  Topic Date Due  . HIV Screening  Never done  . Hepatitis C Screening  Never done    Lab Results: Lipids (09/08/2020) Total Cholesterol: 234 mg/dL Triglycerides:  66 mg/dL HDL:   110 mg/dL LDL:   111 mg/dL Chol/HDL Ratio: 2.1 Non-HDL  124.0    Assessment &  Plan:   1. Essential hypertension Blood pressure is at goal today. Continue diltiazem.  2. Mixed hyperlipidemia Reviewed prior lipid values. His AHA/ACC 10-yr cardiac risk is 3.7% (low). At this point, I feel he is appropriate to maintain on a moderate intensity statin dose (pravastatin 40 mg). Recommend we reassess this in the Fall.  3. Acne rosacea Stable. Continue to follow with dermatology.   Haydee Salter, MD

## 2021-08-14 ENCOUNTER — Ambulatory Visit (INDEPENDENT_AMBULATORY_CARE_PROVIDER_SITE_OTHER): Payer: BC Managed Care – PPO | Admitting: Family Medicine

## 2021-08-14 ENCOUNTER — Encounter: Payer: Self-pay | Admitting: Family Medicine

## 2021-08-14 ENCOUNTER — Other Ambulatory Visit: Payer: Self-pay

## 2021-08-14 VITALS — BP 144/90 | HR 77 | Temp 97.7°F | Ht 74.0 in | Wt 203.0 lb

## 2021-08-14 DIAGNOSIS — R053 Chronic cough: Secondary | ICD-10-CM

## 2021-08-14 DIAGNOSIS — I10 Essential (primary) hypertension: Secondary | ICD-10-CM

## 2021-08-14 DIAGNOSIS — E782 Mixed hyperlipidemia: Secondary | ICD-10-CM | POA: Diagnosis not present

## 2021-08-14 LAB — BASIC METABOLIC PANEL
BUN: 22 mg/dL (ref 6–23)
CO2: 25 mEq/L (ref 19–32)
Calcium: 9.2 mg/dL (ref 8.4–10.5)
Chloride: 102 mEq/L (ref 96–112)
Creatinine, Ser: 1.45 mg/dL (ref 0.40–1.50)
GFR: 54.14 mL/min — ABNORMAL LOW (ref >60.00)
Glucose, Bld: 89 mg/dL (ref 70–99)
Potassium: 4.6 mEq/L (ref 3.5–5.1)
Sodium: 136 mEq/L (ref 135–145)

## 2021-08-14 LAB — LIPID PANEL
Cholesterol: 215 mg/dL — ABNORMAL HIGH (ref 0–200)
HDL: 90 mg/dL (ref >39.00)
LDL Cholesterol: 107 mg/dL — ABNORMAL HIGH (ref 0–99)
NonHDL: 124.82
Total CHOL/HDL Ratio: 2
Triglycerides: 91 mg/dL (ref 0.0–149.0)
VLDL: 18.2 mg/dL (ref 0.0–40.0)

## 2021-08-14 NOTE — Progress Notes (Signed)
[facility] [address] [place] [address] Dept: [phone] Dept Fax: [phone]  Chronic Care Office Visit  Subjective:    Patient ID: Daniel Mercer, male    DOB: 1966-11-10, 55 y.o..   MRN: [id number]  Chief Complaint  Patient presents with   Follow-up    4 mo f/u HTN    History of Present Illness:  Patient is in today for reassessment of chronic medical issues.  Daniel Mercer notes he has had a chronic, intermittent dry or hacking cough over the past 4 months. He denies any nasal congestion, sneezing, or post-nasal drip. He does not have heartburn. He admits to working in a dusty environment, so is unsure if this might be related. He is not a smoker. He has been noted to snore loudly at times. His current SO has noted him sounding like he stops breathing occasionally when sleeping. He denies any nonrestorative sleep or daytime somnolence. He does drink alcohol in the evenings, but has not noted if this is related to his snoring.   Daniel Mercer has a history of hypertension, treated with  diltiazem. He notes that if he needed additional therapy, he would want to avoid diuretics.   He has a history of hyperlipidemia, managed with pravastatin.   Daniel Mercer continues sees dermatology annually related to his rosacea. He notes he has had some flaring of this recently, but to severe enough for him to use his antibiotics for this.  Past Medical History: Patient Active Problem List   Diagnosis Date Noted   Essential hypertension 01/09/2021   Acne rosacea 01/21/2011   Osteoarthritis 10/31/2008   Hemorrhoids 12/18/2007   Hyperlipidemia 12/17/2007   Past Surgical History:  Procedure Laterality Date   bilateral inguinal hernia repairs     left in 1997, right in 2003   Family History  Problem Relation Age of Onset   Colon polyps Mother    Colon polyps Father    Colon polyps Sister    Colon cancer Neg Hx    Esophageal cancer  Neg Hx    Rectal cancer Neg Hx    Stomach cancer Neg Hx    Outpatient Medications Prior to Visit  Medication Sig Dispense Refill   diltiazem (CARDIZEM) 120 MG tablet Take 120 mg by mouth daily. (Patient not taking: Reported on 08/14/2021)     doxycycline (VIBRAMYCIN) 100 MG capsule Take 100 mg by mouth daily.     Multiple Vitamin (MULTIVITAMIN WITH MINERALS) TABS tablet Take 1 tablet by mouth daily.     naproxen sodium (ANAPROX) 220 MG tablet Take 220 mg by mouth daily.     pravastatin (PRAVACHOL) 40 MG tablet TAKE 1 TABLET BY MOUTH ONCE DAILY 30 tablet 4   TIADYLT ER 120 MG 24 hr capsule Take by mouth.     0.9 %  sodium chloride infusion      No facility-administered medications prior to visit.   Allergies  Allergen Reactions   Atorvastatin     REACTION: pt states intol to LIPITOR in past   Simvastatin     REACTION: pt states intol to ZOCOR in past   Shellfish Allergy Rash      Objective:   Today's Vitals   08/14/21 0827  BP: (!) 144/90  Pulse: 77  Temp: 97.7 F (36.5 C)  TempSrc: Temporal  SpO2: 99%  Weight: 203 lb (92.1 kg)  Height: '6\' 2"'$  (1.88 m)   Body mass index is 26.06 kg/m.  General: Well developed, well nourished. No acute distress. HEENT: Normocephalic, non-traumatic. Nose clear without congestion or rhinorrhea. Mucous   membranes moist. Oropharynx clear. Good dentition. Neck: Supple. No lymphadenopathy. No thyromegaly. Lungs: Clear to auscultation bilaterally. No wheezing, rales or rhonchi. CV: RRR without murmurs or rubs. Pulses 2+ bilaterally. Extremities: No edema noted. Psych: Alert and oriented. Normal mood and affect.  Health Maintenance Due  Topic Date Due   HIV Screening  Never done   Hepatitis C Screening  Never done   Zoster Vaccines- Shingrix (1 of 2) Never done   COVID-19 Vaccine (3 - Booster for Pfizer series) 06/17/2021   INFLUENZA VACCINE  Never done   STOP-Bang Score for Sleep Apnea Screening  Patient Self-Reported Questions          Score Do you snore loudly? (louder than  1  talking or sufficiently loud to be  heard through doors)  Do you feel often feel tired, fatigued, or 0  sleepy during the daytime?  Has anyone observed you stop breathing 1  during sleep?  Do you have (or are you being treated for) 1  high blood pressure?  Clinical Information          Score BMI>35 kg/m2     0  Age > 50 years    1  Neck circumference > 40 cm   0  Gender (male)     1   Total      5  A score <3 indicates a low risk of sleep apnea.  Lab Results Lab Results  Component Value Date   CHOL 194 01/24/2014   HDL 78.40 01/24/2014   LDLCALC 88 01/24/2014   LDLDIRECT 104.1 01/21/2013   TRIG 140.0 01/24/2014   CHOLHDL 2 01/24/2014     Assessment & Plan:   1. Essential hypertension Daniel Mercer blood pressure is elevated today compared to his last assessment. We will monitor this for now. I will check his renal function today. I will continue his diltiazem. I will have him return in 3 months for reassessment. If his BP remains elevated, I would consider addition of a 2nd antihypertensive.  - Basic metabolic panel  2. Mixed hyperlipidemia Due for lipid assessment. Continue pravastatin for now.  - Lipid panel  3. Chronic cough Etiology of the cough is not clear. There is no sign of infectious, allergic, or acid reflux issue. He works in a dusty environment at times, so may be occupational. He also has some risk factors for sleep apnea. I offered a sleep study, but he wants to hold off on this for now. We will keep an eye on the cough for now.    Haydee Salter, MD

## 2021-11-13 ENCOUNTER — Ambulatory Visit (INDEPENDENT_AMBULATORY_CARE_PROVIDER_SITE_OTHER): Payer: BC Managed Care – PPO | Admitting: Family Medicine

## 2021-11-13 ENCOUNTER — Other Ambulatory Visit: Payer: Self-pay

## 2021-11-13 VITALS — BP 138/64 | HR 76 | Temp 97.6°F | Ht 74.0 in | Wt 205.6 lb

## 2021-11-13 DIAGNOSIS — I10 Essential (primary) hypertension: Secondary | ICD-10-CM | POA: Diagnosis not present

## 2021-11-13 DIAGNOSIS — E782 Mixed hyperlipidemia: Secondary | ICD-10-CM | POA: Diagnosis not present

## 2021-11-13 DIAGNOSIS — L814 Other melanin hyperpigmentation: Secondary | ICD-10-CM | POA: Insufficient documentation

## 2021-11-13 MED ORDER — ATORVASTATIN CALCIUM 40 MG PO TABS: 40.0000 mg | ORAL_TABLET | Freq: Every day | ORAL | 3 refills | Status: DC

## 2021-11-13 NOTE — Progress Notes (Signed)
[facility] [address] [place] [address] Dept: [phone] Dept Fax: [phone]  Chronic Care Office Visit  Subjective:    Patient ID: Daniel Mercer, male    DOB: 1966-05-26, 55 y.o..   MRN: [id number]  Chief Complaint  Patient presents with   Follow-up    3 mo f/u HTN. Fasting. No main concerns    History of Present Illness:  Patient is in today for reassessment of chronic medical issues.  Mr. Daniel Mercer has a history of hypertension, treated with  diltiazem. He notes he has not yet taken his medication this morning.   He has a history of hyperlipidemia, managed with pravastatin. He previously had not tolerate atorvastatin and simvastatin, ut has been tolerating pravastatin well.  Past Medical History: Patient Active Problem List   Diagnosis Date Noted   Essential hypertension 01/09/2021   Acne rosacea 01/21/2011   Osteoarthritis 10/31/2008   Hemorrhoids 12/18/2007   Hyperlipidemia 12/17/2007   Past Surgical History:  Procedure Laterality Date   bilateral inguinal hernia repairs     left in 1997, right in 2003   Family History  Problem Relation Age of Onset   Colon polyps Mother    Colon polyps Father    Colon polyps Sister    Colon cancer Neg Hx    Esophageal cancer Neg Hx    Rectal cancer Neg Hx    Stomach cancer Neg Hx    Outpatient Medications Prior to Visit  Medication Sig Dispense Refill   doxycycline (VIBRAMYCIN) 100 MG capsule Take 100 mg by mouth daily.     Multiple Vitamin (MULTIVITAMIN WITH MINERALS) TABS tablet Take 1 tablet by mouth daily.     naproxen sodium (ANAPROX) 220 MG tablet Take 220 mg by mouth daily.     TIADYLT ER 120 MG 24 hr capsule Take by mouth.     pravastatin (PRAVACHOL) 40 MG tablet TAKE 1 TABLET BY MOUTH ONCE DAILY 30 tablet 4   diltiazem (CARDIZEM) 120 MG tablet Take 120 mg by mouth daily. (Patient not taking: Reported on 08/14/2021)     No facility-administered  medications prior to visit.   Allergies  Allergen Reactions   Atorvastatin     REACTION: pt states intol to LIPITOR in past   Simvastatin     REACTION: pt states intol to ZOCOR in past   Shellfish Allergy Rash   Objective:   Today's Vitals   11/13/21 0823  BP: 138/64  Pulse: 76  Temp: 97.6 F (36.4 C)  TempSrc: Temporal  SpO2: 99%  Weight: 205 lb 9.6 oz (93.3 kg)  Height: 6\' 2"  (1.88 m)   Body mass index is 26.4 kg/m.   General: Well developed, well nourished. No acute distress. Psych: Alert and oriented. Normal mood and affect.  Health Maintenance Due  Topic Date Due   HIV Screening  Never done   Hepatitis C Screening  Never done   Zoster Vaccines- Shingrix (1 of 2) Never done   COVID-19 Vaccine (3 - Booster for Pfizer series) 03/15/2021   INFLUENZA VACCINE  07/02/2021   Lab Results Last metabolic panel Lab Results  Component Value Date   GLUCOSE 89 08/14/2021   NA 136 08/14/2021   K 4.6 08/14/2021   CL 102 08/14/2021   CO2 25 08/14/2021   BUN 22 08/14/2021   CREATININE 1.45 08/14/2021   CALCIUM 9.2 08/14/2021   PROT 7.4 01/24/2014   ALBUMIN 3.9 01/24/2014   BILITOT 0.6 01/24/2014  ALKPHOS 83 01/24/2014   AST 40 (H) 01/24/2014   ALT 29 01/24/2014   Last lipids Lab Results  Component Value Date   CHOL 215 (H) 08/14/2021   HDL 90.00 08/14/2021   LDLCALC 107 (H) 08/14/2021   LDLDIRECT 104.1 01/21/2013   TRIG 91.0 08/14/2021   CHOLHDL 2 08/14/2021      Assessment & Plan:   1. Essential hypertension Blood pressure is improved today, even without having taken his morning medicine. His last renal labs showed Stage 3a CKD. Mr. Daniel Mercer is concerned about the frequency of office visits (every 3 months). However, he acknowledges I can't help him monitor this in a vacuum.  2. Mixed hyperlipidemia Mr. Daniel Mercer last lipids showed an LDL above goal, though improved from previously. We discussed a switch back to atorvastatin (as a trial to see if he will tolerate  this now) vs. addition of ezetimibe. He is agreeable to trying the atorvastatin switch.I will plan to recheck his lipids in 3 months.  - atorvastatin (LIPITOR) 40 MG tablet; Take 1 tablet (40 mg total) by mouth daily.  Dispense: 90 tablet; Refill: 3  Haydee Salter, MD

## 2022-02-12 ENCOUNTER — Encounter: Payer: Self-pay | Admitting: Family Medicine

## 2022-02-12 ENCOUNTER — Other Ambulatory Visit: Payer: Self-pay

## 2022-02-12 ENCOUNTER — Ambulatory Visit (INDEPENDENT_AMBULATORY_CARE_PROVIDER_SITE_OTHER): Payer: BC Managed Care – PPO | Admitting: Family Medicine

## 2022-02-12 VITALS — BP 142/86 | HR 75 | Temp 98.4°F | Ht 74.0 in | Wt 213.2 lb

## 2022-02-12 DIAGNOSIS — I10 Essential (primary) hypertension: Secondary | ICD-10-CM

## 2022-02-12 DIAGNOSIS — E782 Mixed hyperlipidemia: Secondary | ICD-10-CM | POA: Diagnosis not present

## 2022-02-12 LAB — LIPID PANEL
Cholesterol: 176 mg/dL (ref 0–200)
HDL: 97.8 mg/dL (ref >39.00)
LDL Cholesterol: 60 mg/dL (ref 0–99)
NonHDL: 78.57
Total CHOL/HDL Ratio: 2
Triglycerides: 95 mg/dL (ref 0.0–149.0)
VLDL: 19 mg/dL (ref 0.0–40.0)

## 2022-02-12 MED ORDER — AMLODIPINE BESYLATE 10 MG PO TABS: 10.0000 mg | ORAL_TABLET | Freq: Every day | ORAL | 3 refills | Status: DC

## 2022-02-12 NOTE — Progress Notes (Signed)
?[facility] ?[place] ?[place] [address] ?Dept: [phone] ?Dept Fax: [phone] ? ?Chronic Care Office Visit ? ?Subjective:  ? ? Patient ID: Daniel Mercer, male    DOB: January 15, 1966, 56 y.o..   MRN: [id number] ? ?Chief Complaint  ?Patient presents with  ? Follow-up  ?  3 month f/u.   No concerns.  Fasting today.   ? ? ?History of Present Illness: ? ?Patient is in today for reassessment of chronic medical issues. ? ?Daniel Mercer has hypertension and Stage 3a CKD. He has been managed on diltiazem. He recalls that this agent may have been selected based on an arrhythmia issue he was having at the time. He prefers to avoid a diuretic. ?  ?Daniel Mercer has hyperlipidemia. At his last visit, we switched him from pravastatin to atorvastatin. He had previously had some intolerance of certain statins, but had been managing well on pravastatin, just not reaching goals. He is taking the atorvastatin without complaints at this point.  ? ?Past Medical History: ?Patient Active Problem List  ? Diagnosis Date Noted  ? Lentigo 11/13/2021  ? Essential hypertension 01/09/2021  ? Acne rosacea 01/21/2011  ? Osteoarthritis 10/31/2008  ? Hemorrhoids 12/18/2007  ? Hyperlipidemia 12/17/2007  ? ?Past Surgical History:  ?Procedure Laterality Date  ? bilateral inguinal hernia repairs    ? left in 1997, right in 2003  ? ?Family History  ?Problem Relation Age of Onset  ? Colon polyps Mother   ? Colon polyps Father   ? Colon polyps Sister   ? Colon cancer Neg Hx   ? Esophageal cancer Neg Hx   ? Rectal cancer Neg Hx   ? Stomach cancer Neg Hx   ? ?Outpatient Medications Prior to Visit  ?Medication Sig Dispense Refill  ? atorvastatin (LIPITOR) 40 MG tablet Take 1 tablet (40 mg total) by mouth daily. 90 tablet 3  ? doxycycline (VIBRAMYCIN) 100 MG capsule Take 100 mg by mouth daily.    ? Multiple Vitamin (MULTIVITAMIN WITH MINERALS) TABS tablet Take 1 tablet by mouth daily.    ? naproxen  sodium (ANAPROX) 220 MG tablet Take 220 mg by mouth daily.    ? TIADYLT ER 120 MG 24 hr capsule Take by mouth.    ? ?No facility-administered medications prior to visit.  ? ? ?Allergies  ?Allergen Reactions  ? Atorvastatin   ?  REACTION: pt states intol to LIPITOR in past  ? Simvastatin   ?  REACTION: pt states intol to ZOCOR in past  ? Shellfish Allergy Rash  ? ?   ?Objective:  ? ?Today's Vitals  ? 02/12/22 0901 02/12/22 0904  ?BP: (!) 144/86 (!) 142/86  ?Pulse: 75   ?Temp: 98.4 ?F (36.9 ?C)   ?TempSrc: Temporal   ?SpO2: 98%   ?Weight: 213 lb 3.2 oz (96.7 kg)   ?Height: '6\' 2"'$  (1.88 m)   ? ?Body mass index is 27.37 kg/m?.  ? ?General: Well developed, well nourished. No acute distress. ?Psych: Alert and oriented. Normal mood and affect. ? ?Health Maintenance Due  ?Topic Date Due  ? HIV Screening  Never done  ? Hepatitis C Screening  Never done  ? Zoster Vaccines- Shingrix (1 of 2) Never done  ? ?Lab Results ?Last lipids ?Lab Results  ?Component Value Date  ? CHOL 215 (H) 08/14/2021  ? HDL 90.00 08/14/2021  ? LDLCALC 107 (H) 08/14/2021  ? LDLDIRECT 104.1 01/21/2013  ? TRIG 91.0 08/14/2021  ? CHOLHDL 2  08/14/2021  ? ?Assessment & Plan:  ? ?1. Essential hypertension ?Daniel Mercer's blood pressure remains above goal. We discussed switching him to a different CCB to see if we get better control. I will switch him to amlodipine and we will recheck his blood pressure in 3 months. I asked him to keep a log of home blood pressures for me to review at his next visit. ? ?- amLODipine (NORVASC) 10 MG tablet; Take 1 tablet (10 mg total) by mouth daily.  Dispense: 90 tablet; Refill: 3 ? ?2. Mixed hyperlipidemia ?Due for repeat lipids today to assess response to atorvastatin. ? ?- Lipid panel ? ?Return in about 3 months (around 05/15/2022) for Reassessment.  ? ?Haydee Salter, MD ?

## 2022-02-26 ENCOUNTER — Encounter: Payer: Self-pay | Admitting: Family Medicine

## 2022-02-27 ENCOUNTER — Ambulatory Visit (INDEPENDENT_AMBULATORY_CARE_PROVIDER_SITE_OTHER): Payer: BC Managed Care – PPO | Admitting: Family Medicine

## 2022-02-27 ENCOUNTER — Other Ambulatory Visit: Payer: Self-pay

## 2022-02-27 VITALS — BP 146/82 | HR 82 | Temp 97.3°F | Wt 213.8 lb

## 2022-02-27 DIAGNOSIS — R600 Localized edema: Secondary | ICD-10-CM

## 2022-02-27 DIAGNOSIS — L239 Allergic contact dermatitis, unspecified cause: Secondary | ICD-10-CM | POA: Diagnosis not present

## 2022-02-27 DIAGNOSIS — T50905A Adverse effect of unspecified drugs, medicaments and biological substances, initial encounter: Secondary | ICD-10-CM

## 2022-02-27 DIAGNOSIS — I10 Essential (primary) hypertension: Secondary | ICD-10-CM | POA: Diagnosis not present

## 2022-02-27 MED ORDER — LISINOPRIL 10 MG PO TABS: 10.0000 mg | ORAL_TABLET | Freq: Every day | ORAL | 3 refills | Status: DC

## 2022-02-27 NOTE — Progress Notes (Signed)
?[facility] ?[facility] ?[place] ?[place] [address] ?Dept: [phone] ?Dept Fax: [phone] ? ?Office Visit ? ?Subjective:  ? ? Patient ID: Daniel Mercer, male    DOB: 08-14-1966, 56 y.o..   MRN: [id number] ? ?Chief Complaint  ?Patient presents with  ? Follow-up  ?  F/u meds. C/o having bilateral ankle swelling and hives on legs x 1 week.   ? ? ?History of Present Illness: ? ?Patient is in today for assessment of lower leg edema and a rash. I had seen Mr. Gacek on 3/14. As his blood pressures remained above goal. We had switched him to amlodipine 10 mg daily. He notes that within a week, he was having swelling to both lower legs. He also gradually developed a pruritic rash to the lower legs. He prefers to avoid diuretics and has not been on other antihypertensives. ? ?Past Medical History: ?Patient Active Problem List  ? Diagnosis Date Noted  ? Lentigo 11/13/2021  ? Essential hypertension 01/09/2021  ? Acne rosacea 01/21/2011  ? Osteoarthritis 10/31/2008  ? Hemorrhoids 12/18/2007  ? Hyperlipidemia 12/17/2007  ? ?Past Surgical History:  ?Procedure Laterality Date  ? bilateral inguinal hernia repairs    ? left in 1997, right in 2003  ? ?Family History  ?Problem Relation Age of Onset  ? Colon polyps Mother   ? Colon polyps Father   ? Colon polyps Sister   ? Colon cancer Neg Hx   ? Esophageal cancer Neg Hx   ? Rectal cancer Neg Hx   ? Stomach cancer Neg Hx   ? ? ?Outpatient Medications Prior to Visit  ?Medication Sig Dispense Refill  ? atorvastatin (LIPITOR) 40 MG tablet Take 1 tablet (40 mg total) by mouth daily. 90 tablet 3  ? doxycycline (VIBRAMYCIN) 100 MG capsule Take 100 mg by mouth daily.    ? Multiple Vitamin (MULTIVITAMIN WITH MINERALS) TABS tablet Take 1 tablet by mouth daily.    ? naproxen sodium (ANAPROX) 220 MG tablet Take 220 mg by mouth daily.    ? amLODipine (NORVASC) 10 MG tablet Take 1 tablet (10 mg total) by mouth daily. 90 tablet 3  ? ?No  facility-administered medications prior to visit.  ? ?Allergies  ?Allergen Reactions  ? Atorvastatin   ?  REACTION: pt states intol to LIPITOR in past  ? Simvastatin   ?  REACTION: pt states intol to ZOCOR in past  ? Amlodipine Swelling and Rash  ? Shellfish Allergy Rash  ? ?   ?Objective:  ? ?Today's Vitals  ? 02/27/22 0804  ?BP: (!) 146/82  ?Pulse: 82  ?Temp: (!) 97.3 ?F (36.3 ?C)  ?TempSrc: Temporal  ?SpO2: 97%  ?Weight: 213 lb 12.8 oz (97 kg)  ? ?Body mass index is 27.45 kg/m?.  ? ?General: Well developed, well nourished. No acute distress. ?Extremities: 1-2+ edema of both lower legs. ?Skin: Warm and dry. There are indistinct patches of a red maculopapular rash on both lower legs. This is mostly in the  ? area covered by socks. ?Psych: Alert and oriented. Normal mood and affect. ? ?Health Maintenance Due  ?Topic Date Due  ? HIV Screening  Never done  ? Hepatitis C Screening  Never done  ? Zoster Vaccines- Shingrix (1 of 2) Never done  ?   ?Assessment & Plan:  ? ?1. Adverse effect of drug, initial encounter ?2. Lower leg edema ?The edema is a well known side effect of amlodipine. This should resolve with stopping his  medication. ? ?3. Allergic dermatitis ?This is possibly a drug side effect. Because of the distribution, this could also be in reaction to the edema or could have been a contact dermatitis related to a detergent or his use of an ACE wrap (though Mr. Lerette recalls the rash appeared ahead of his use of an ACE). ? ?4. Essential hypertension ?Blood pressure remains high. I will try switching Mr. Vana to lisinopril. I discussed common side effects. I will have him back in 2 weeks for a BP check and a BMP. ? ?- lisinopril (ZESTRIL) 10 MG tablet; Take 1 tablet (10 mg total) by mouth daily.  Dispense: 90 tablet; Refill: 3 ? ? ?Return in about 2 weeks (around 03/13/2022) for Reassessment.  ? ?Haydee Salter, MD ?

## 2022-02-27 NOTE — Patient Instructions (Signed)
Stop amlodipine.

## 2022-03-13 ENCOUNTER — Ambulatory Visit (INDEPENDENT_AMBULATORY_CARE_PROVIDER_SITE_OTHER): Payer: BC Managed Care – PPO | Admitting: Family Medicine

## 2022-03-13 ENCOUNTER — Encounter: Payer: Self-pay | Admitting: Family Medicine

## 2022-03-13 VITALS — BP 134/70 | HR 78 | Temp 97.9°F | Ht 74.0 in | Wt 209.4 lb

## 2022-03-13 DIAGNOSIS — I10 Essential (primary) hypertension: Secondary | ICD-10-CM

## 2022-03-13 LAB — BASIC METABOLIC PANEL
BUN: 31 mg/dL — ABNORMAL HIGH (ref 6–23)
CO2: 23 mEq/L (ref 19–32)
Calcium: 9 mg/dL (ref 8.4–10.5)
Chloride: 102 mEq/L (ref 96–112)
Creatinine, Ser: 1.43 mg/dL (ref 0.40–1.50)
GFR: 54.83 mL/min — ABNORMAL LOW (ref >60.00)
Glucose, Bld: 85 mg/dL (ref 70–99)
Potassium: 4.4 mEq/L (ref 3.5–5.1)
Sodium: 135 mEq/L (ref 135–145)

## 2022-03-13 NOTE — Progress Notes (Signed)
?[facility] ?[facility] ?[place] ?[place] [address] ?Dept: [phone] ?Dept Fax: [phone] ? ?Office Visit ? ?Subjective:  ? ? Patient ID: Daniel Mercer, male    DOB: 1966-11-12, 56 y.o..   MRN: [id number] ? ?Chief Complaint  ?Patient presents with  ? Follow-up  ?  2 week f/u, no concerns.   ? ? ?History of Present Illness: ? ?Patient is in today for reassessment of his blood pressure. Mr. Villanueva has a history of hypertension and CKD Stage 3a. He had been managed on diltiazem, which did not seem to be controlling the blood pressure well. I switched him to amlodipine. Unfortunately, he developed lower leg edema and rash associated with this and his BP remained above goal. Two weeks ago, we switched him to lisinopril. He notes his lower leg edema has resolved and the rash has cleared. He notes he often has a mild cough, so can't decide if this is any worse or not. ? ?Past Medical History: ?Patient Active Problem List  ? Diagnosis Date Noted  ? Lentigo 11/13/2021  ? Essential hypertension 01/09/2021  ? Acne rosacea 01/21/2011  ? Osteoarthritis 10/31/2008  ? Hemorrhoids 12/18/2007  ? Hyperlipidemia 12/17/2007  ? ?Past Surgical History:  ?Procedure Laterality Date  ? bilateral inguinal hernia repairs    ? left in 1997, right in 2003  ? ?Family History  ?Problem Relation Age of Onset  ? Colon polyps Mother   ? Colon polyps Father   ? Colon polyps Sister   ? Colon cancer Neg Hx   ? Esophageal cancer Neg Hx   ? Rectal cancer Neg Hx   ? Stomach cancer Neg Hx   ? ?Outpatient Medications Prior to Visit  ?Medication Sig Dispense Refill  ? atorvastatin (LIPITOR) 40 MG tablet Take 1 tablet (40 mg total) by mouth daily. 90 tablet 3  ? doxycycline (VIBRAMYCIN) 100 MG capsule Take 100 mg by mouth daily.    ? lisinopril (ZESTRIL) 10 MG tablet Take 1 tablet (10 mg total) by mouth daily. 90 tablet 3  ? Multiple Vitamin (MULTIVITAMIN WITH MINERALS) TABS tablet Take 1 tablet by  mouth daily.    ? naproxen sodium (ANAPROX) 220 MG tablet Take 220 mg by mouth daily.    ? ?No facility-administered medications prior to visit.  ? ?Allergies  ?Allergen Reactions  ? Atorvastatin   ?  REACTION: pt states intol to LIPITOR in past  ? Simvastatin   ?  REACTION: pt states intol to ZOCOR in past  ? Amlodipine Swelling and Rash  ? Shellfish Allergy Rash  ? ?Objective:  ? ?Today's Vitals  ? 03/13/22 0809  ?BP: 134/70  ?Pulse: 78  ?Temp: 97.9 ?F (36.6 ?C)  ?TempSrc: Temporal  ?SpO2: 98%  ?Weight: 209 lb 6.4 oz (95 kg)  ?Height: '6\' 2"'$  (1.88 m)  ? ?Body mass index is 26.89 kg/m?.  ? ?General: Well developed, well nourished. No acute distress. ?Extremities: No edema noted. ?Skin: Warm and dry. No rashes. ?Psych: Alert and oriented. Normal mood and affect. ? ?Health Maintenance Due  ?Topic Date Due  ? HIV Screening  Never done  ? Hepatitis C Screening  Never done  ? Zoster Vaccines- Shingrix (1 of 2) Never done  ?   ?Assessment & Plan:  ? ?1. Essential hypertension ?Mr. Cordner's BP is improved on lisinopril. His weight is down 4 lb. since his last visit, at least partially due to resolution of his edema. I will check his renal function  today. If his labs are stable, we will plan to continue the lisinopril 10 mg daily. ? ?- Basic metabolic panel ? ? ?Return in about 3 months (around 06/12/2022) for Reassessment.  ? ?Haydee Salter, MD ?

## 2022-03-26 DIAGNOSIS — D225 Melanocytic nevi of trunk: Secondary | ICD-10-CM | POA: Diagnosis not present

## 2022-03-26 DIAGNOSIS — L814 Other melanin hyperpigmentation: Secondary | ICD-10-CM | POA: Diagnosis not present

## 2022-03-26 DIAGNOSIS — L821 Other seborrheic keratosis: Secondary | ICD-10-CM | POA: Diagnosis not present

## 2022-03-26 DIAGNOSIS — L578 Other skin changes due to chronic exposure to nonionizing radiation: Secondary | ICD-10-CM | POA: Diagnosis not present

## 2022-05-16 ENCOUNTER — Ambulatory Visit: Payer: BC Managed Care – PPO | Admitting: Family Medicine

## 2022-06-11 ENCOUNTER — Encounter: Payer: Self-pay | Admitting: Family Medicine

## 2022-06-11 ENCOUNTER — Ambulatory Visit (INDEPENDENT_AMBULATORY_CARE_PROVIDER_SITE_OTHER): Payer: BC Managed Care – PPO | Admitting: Family Medicine

## 2022-06-11 VITALS — BP 126/80 | HR 75 | Temp 97.8°F | Ht 74.0 in | Wt 205.6 lb

## 2022-06-11 DIAGNOSIS — R058 Other specified cough: Secondary | ICD-10-CM

## 2022-06-11 DIAGNOSIS — I10 Essential (primary) hypertension: Secondary | ICD-10-CM | POA: Diagnosis not present

## 2022-06-11 DIAGNOSIS — T464X5A Adverse effect of angiotensin-converting-enzyme inhibitors, initial encounter: Secondary | ICD-10-CM

## 2022-06-11 MED ORDER — LOSARTAN POTASSIUM 50 MG PO TABS: 50.0000 mg | ORAL_TABLET | Freq: Every day | ORAL | 3 refills | Status: DC

## 2022-06-11 NOTE — Progress Notes (Signed)
[facility] [address] [place] [address] Dept: [phone] Dept Fax: [phone]  Chronic Care Office Visit  Subjective:    Patient ID: Daniel Mercer, male    DOB: Dec 15, 1965, 56 y.o..   MRN: [id number]  Chief Complaint  Patient presents with   Follow-up    3 month f/u.  Not fasting today.  Still has a dry cough.   Declines shingles vaccine.     History of Present Illness:  Patient is in today for reassessment of chronic medical issues.  Daniel Mercer has a history of hypertension. He had been managed on diltiazem, which did not seem to be controlling the blood pressure well. I switched him to amlodipine. Unfortunately, he developed lower leg edema and rash associated with this and his BP remained above goal. Earlier this Spring, we switched him to lisinopril. The lower leg edema resolved and the rash cleared. He did have a pre-existing dry cough , but he feels this has worsened since he started the lisinopril. He notes this daily. He does not have a history of allergies, but he has wondered about environmental exposures that may have caused this.  Past Medical History: Patient Active Problem List   Diagnosis Date Noted   Lentigo 11/13/2021   Essential hypertension 01/09/2021   Acne rosacea 01/21/2011   Osteoarthritis 10/31/2008   Hemorrhoids 12/18/2007   Hyperlipidemia 12/17/2007   Past Surgical History:  Procedure Laterality Date   bilateral inguinal hernia repairs     left in 1997, right in 2003   Family History  Problem Relation Age of Onset   Colon polyps Mother    Colon polyps Father    Colon polyps Sister    Colon cancer Neg Hx    Esophageal cancer Neg Hx    Rectal cancer Neg Hx    Stomach cancer Neg Hx    Outpatient Medications Prior to Visit  Medication Sig Dispense Refill   atorvastatin (LIPITOR) 40 MG tablet Take 1 tablet (40 mg total) by mouth daily. 90 tablet 3   doxycycline (VIBRAMYCIN) 100 MG  capsule Take 100 mg by mouth daily.     Multiple Vitamin (MULTIVITAMIN WITH MINERALS) TABS tablet Take 1 tablet by mouth daily.     naproxen sodium (ANAPROX) 220 MG tablet Take 220 mg by mouth daily.     lisinopril (ZESTRIL) 10 MG tablet Take 1 tablet (10 mg total) by mouth daily. 90 tablet 3   No facility-administered medications prior to visit.   Allergies  Allergen Reactions   Atorvastatin     REACTION: pt states intol to LIPITOR in past   Lisinopril Cough   Simvastatin     REACTION: pt states intol to ZOCOR in past   Amlodipine Swelling and Rash   Shellfish Allergy Rash     Objective:   Today's Vitals   06/11/22 0822  BP: 126/80  Pulse: 75  Temp: 97.8 F (36.6 C)  TempSrc: Temporal  SpO2: 97%  Weight: 205 lb 9.6 oz (93.3 kg)  Height: '6\' 2"'$  (1.88 m)   Body mass index is 26.4 kg/m.   General: Well developed, well nourished. No acute distress. HEENT: Normocephalic, non-traumatic. Nose clear without congestion or rhinorrhea. Mucous membranes moist.   Oropharynx clear. Good dentition. Psych: Alert and oriented. Normal mood and affect.  Health Maintenance Due  Topic Date Due   HIV Screening  Never done   Hepatitis C Screening  Never done    Assessment & Plan:  1. Essential hypertension 2. Cough due to ACE inhibitor BP is at goal on lisinopril. However, Daniel Mercer pre-existing cough is worsened, which raises concern for this being due to his ACE-I. I will try switching him to losartan to see if her tolerates this better and can hopefully maintain his BP control. He will continue to monitor his BP at home.  - losartan (COZAAR) 50 MG tablet; Take 1 tablet (50 mg total) by mouth daily.  Dispense: 90 tablet; Refill:    Return in about 3 months (around 09/11/2022) for Reassessment.   Daniel Salter, MD

## 2022-09-10 ENCOUNTER — Ambulatory Visit (INDEPENDENT_AMBULATORY_CARE_PROVIDER_SITE_OTHER): Payer: BC Managed Care – PPO | Admitting: Family Medicine

## 2022-09-10 ENCOUNTER — Encounter: Payer: Self-pay | Admitting: Family Medicine

## 2022-09-10 VITALS — BP 130/84 | HR 86 | Temp 97.7°F | Ht 74.0 in | Wt 209.2 lb

## 2022-09-10 DIAGNOSIS — L719 Rosacea, unspecified: Secondary | ICD-10-CM

## 2022-09-10 DIAGNOSIS — I10 Essential (primary) hypertension: Secondary | ICD-10-CM

## 2022-09-10 NOTE — Progress Notes (Signed)
[facility] [address] [place] [address] Dept: [phone] Dept Fax: [phone]  Chronic Care Office Visit  Subjective:    Patient ID: Daniel Mercer, male    DOB: August 22, 1966, 56 y.o..   MRN: [id number]  Chief Complaint  Patient presents with   Follow-up    3 month f/u .  No concerns.  Not fasting.   Bp at home ?/82-76.  Declines flu shot today.     History of Present Illness:  Patient is in today for reassessment of chronic medical issues.  Daniel Mercer has a history of hypertension. He is currently managed on losartan 50 mg daily. He was switched to this at his last visit, as he was having a cough associated with lisinopril. He notes the cough has since resolved and he is feeling better. He had previously had leg swelling associated with amlodipine use. This has resolved at this point.  Daniel Mercer has a history of rosacea. He notes that he has some minor areas of inflammation, but that this is manageable. About twice a year, he gets a significant flare of his skin condition. He was tried on multiple topical preparations for this. What has consistently worked is a course of doxycycline 100 mg daily for about 1 month. This seems to hold him ofr several months afterwards.  Past Medical History: Patient Active Problem List   Diagnosis Date Noted   Lentigo 11/13/2021   Essential hypertension 01/09/2021   Acne rosacea 01/21/2011   Osteoarthritis 10/31/2008   Hemorrhoids 12/18/2007   Hyperlipidemia 12/17/2007   Past Surgical History:  Procedure Laterality Date   bilateral inguinal hernia repairs     left in 1997, right in 2003   Family History  Problem Relation Age of Onset   Colon polyps Mother    Colon polyps Father    Colon polyps Sister    Colon cancer Neg Hx    Esophageal cancer Neg Hx    Rectal cancer Neg Hx    Stomach cancer Neg Hx     Outpatient Medications Prior to Visit  Medication Sig Dispense Refill    atorvastatin (LIPITOR) 40 MG tablet Take 1 tablet (40 mg total) by mouth daily. 90 tablet 3   losartan (COZAAR) 50 MG tablet Take 1 tablet (50 mg total) by mouth daily. 90 tablet 3   Multiple Vitamin (MULTIVITAMIN WITH MINERALS) TABS tablet Take 1 tablet by mouth daily.     naproxen sodium (ANAPROX) 220 MG tablet Take 220 mg by mouth daily.     doxycycline (VIBRAMYCIN) 100 MG capsule Take 100 mg by mouth daily.     No facility-administered medications prior to visit.   Allergies  Allergen Reactions   Atorvastatin     REACTION: pt states intol to LIPITOR in past   Lisinopril Cough   Simvastatin     REACTION: pt states intol to ZOCOR in past   Amlodipine Swelling and Rash   Shellfish Allergy Rash      Objective:   Today's Vitals   09/10/22 0818  BP: 130/84  Pulse: 86  Temp: 97.7 F (36.5 C)  TempSrc: Temporal  SpO2: 97%  Weight: 209 lb 3.2 oz (94.9 kg)  Height: '6\' 2"'$  (1.88 m)   Body mass index is 26.86 kg/m.   General: Well developed, well nourished. No acute distress. Skin: Warm and dry. There are several red, irregular papular lesions on the cheeks with associated   areas of telangectasias. Psych: Alert and oriented.  Normal mood and affect.  Health Maintenance Due  Topic Date Due   HIV Screening  Never done   Hepatitis C Screening  Never done     Assessment & Plan:   1. Essential hypertension Blood pressure is in acceptable control. Continue losartan 50 mg daily.  2. Acne rosacea Stable. He feels comfortable with the use of intermittent doxycycline for management of this.  Return in about 4 months (around 01/11/2023) for Reassessment.   Haydee Salter, MD

## 2022-11-06 ENCOUNTER — Other Ambulatory Visit: Payer: Self-pay | Admitting: Family Medicine

## 2022-11-06 DIAGNOSIS — E782 Mixed hyperlipidemia: Secondary | ICD-10-CM

## 2023-01-14 ENCOUNTER — Ambulatory Visit (INDEPENDENT_AMBULATORY_CARE_PROVIDER_SITE_OTHER): Payer: BC Managed Care – PPO | Admitting: Family Medicine

## 2023-01-14 ENCOUNTER — Encounter: Payer: Self-pay | Admitting: Family Medicine

## 2023-01-14 VITALS — BP 134/82 | HR 86 | Temp 98.6°F | Ht 74.0 in | Wt 216.0 lb

## 2023-01-14 DIAGNOSIS — I10 Essential (primary) hypertension: Secondary | ICD-10-CM | POA: Diagnosis not present

## 2023-01-14 DIAGNOSIS — E782 Mixed hyperlipidemia: Secondary | ICD-10-CM | POA: Diagnosis not present

## 2023-01-14 DIAGNOSIS — L719 Rosacea, unspecified: Secondary | ICD-10-CM

## 2023-01-14 NOTE — Assessment & Plan Note (Signed)
Blood pressure is in good control. Continue losartan 50 mg daily.

## 2023-01-14 NOTE — Assessment & Plan Note (Signed)
Continue atorvastatin 40 mg daily.  

## 2023-01-14 NOTE — Progress Notes (Signed)
[facility] [address] [place] [address] Dept: [phone] Dept Fax: [phone]  Chronic Care Office Visit  Subjective:    Patient ID: Daniel Mercer, male    DOB: February 06, 1966, 57 y.o..   MRN: [id number]  Chief Complaint  Patient presents with   Medical Management of Chronic Issues    4 month f/u, no concerns.   Not fasting today.    History of Present Illness:  Patient is in today for reassessment of chronic medical issues.  Mr. Deck has a history of hypertension. He is currently managed on losartan 50 mg daily. He was switched to this due to cough associated with lisinopril. He had previously had leg swelling associated with amlodipine use. These symptoms are all resolved and he is tolerating his medicine well.  Mr. Deras has a history of hyperlipidemia. He is currently managed on atorvastatin 40 mg daily.  Past Medical History: Patient Active Problem List   Diagnosis Date Noted   Lentigo 11/13/2021   Essential hypertension 01/09/2021   Acne rosacea 01/21/2011   Osteoarthritis 10/31/2008   Hemorrhoids 12/18/2007   Hyperlipidemia 12/17/2007   Past Surgical History:  Procedure Laterality Date   bilateral inguinal hernia repairs     left in 1997, right in 2003   Family History  Problem Relation Age of Onset   Colon polyps Mother    Colon polyps Father    Colon polyps Sister    Colon cancer Neg Hx    Esophageal cancer Neg Hx    Rectal cancer Neg Hx    Stomach cancer Neg Hx    Outpatient Medications Prior to Visit  Medication Sig Dispense Refill   atorvastatin (LIPITOR) 40 MG tablet TAKE 1 TABLET BY MOUTH EVERY DAY 90 tablet 3   losartan (COZAAR) 50 MG tablet Take 1 tablet (50 mg total) by mouth daily. 90 tablet 3   Multiple Vitamin (MULTIVITAMIN WITH MINERALS) TABS tablet Take 1 tablet by mouth daily.     naproxen sodium (ANAPROX) 220 MG tablet Take 220 mg by mouth daily.     No facility-administered  medications prior to visit.   Allergies  Allergen Reactions   Atorvastatin     REACTION: pt states intol to LIPITOR in past   Lisinopril Cough   Simvastatin     REACTION: pt states intol to ZOCOR in past   Amlodipine Swelling and Rash   Shellfish Allergy Rash   Objective:   Today's Vitals   01/14/23 0752  BP: 134/82  Pulse: 86  Temp: 98.6 F (37 C)  TempSrc: Temporal  SpO2: 98%  Weight: 216 lb (98 kg)  Height: 6' 2"$  (1.88 m)   Body mass index is 27.73 kg/m.   General: Well developed, well nourished. No acute distress. HEENT: Normocephalic, non-traumatic. External ears normal. EAC and TMs normal bilaterally. PERRL, EOMI. Conjunctiva clear. Fundiscopic exam shows normal disc and vasculature. Nose   clear without congestion or rhinorrhea. Mucous membranes moist. Oropharynx clear. Good dentition. Neck: Supple. No lymphadenopathy. No thyromegaly. Lungs: Clear to auscultation bilaterally. No wheezing, rales or rhonchi. CV: RRR without murmurs or rubs. Pulses 2+ bilaterally. Abdomen: Soft, non-tender. Bowel sounds positive, normal pitch and frequency. No hepatosplenomegaly. No rebound or guarding. Back: Straight. No CVA tenderness bilaterally. Extremities: Full ROM. No joint swelling or tenderness. No edema noted. Skin: Warm and dry. No rashes. Neuro: CN II-XII intact. Normal sensation and DTR bilaterally. Psych: Alert and oriented. Normal mood and affect.  Health Maintenance Due  Topic Date Due   HIV Screening  Never done   Hepatitis C Screening  Never done   Zoster Vaccines- Shingrix (1 of 2) Never done    Lab Results    Latest Ref Rng & Units 03/13/2022    8:19 AM 08/14/2021    9:01 AM 01/24/2014    7:33 AM  CMP  Glucose 70 - 99 mg/dL 85  89  84   BUN 6 - 23 mg/dL 31  22  17   $ Creatinine 0.40 - 1.50 mg/dL 1.43  1.45  1.0   Sodium 135 - 145 mEq/L 135  136  139   Potassium 3.5 - 5.1 mEq/L 4.4  4.6  5.1   Chloride 96 - 112 mEq/L 102  102  104   CO2 19 - 32 mEq/L 23   25  28   $ Calcium 8.4 - 10.5 mg/dL 9.0  9.2  9.3   Total Protein 6.0 - 8.3 g/dL   7.4   Total Bilirubin 0.3 - 1.2 mg/dL   0.6   Alkaline Phos 39 - 117 U/L   83   AST 0 - 37 U/L   40   ALT 0 - 53 U/L   29    Lab Results  Component Value Date   CHOL 176 02/12/2022   HDL 97.80 02/12/2022   LDLCALC 60 02/12/2022   LDLDIRECT 104.1 01/21/2013   TRIG 95.0 02/12/2022   CHOLHDL 2 02/12/2022     Assessment & Plan:   Problem List Items Addressed This Visit       Cardiovascular and Mediastinum   Essential hypertension - Primary    Blood pressure is in good control. Continue losartan 50 mg daily.        Other   Hyperlipidemia    Continue atorvastatin 40 mg daily.       Return in about 4 months (around 05/15/2023) for Reassessment.   Haydee Salter, MD

## 2023-04-16 DIAGNOSIS — L814 Other melanin hyperpigmentation: Secondary | ICD-10-CM | POA: Diagnosis not present

## 2023-04-16 DIAGNOSIS — L821 Other seborrheic keratosis: Secondary | ICD-10-CM | POA: Diagnosis not present

## 2023-04-16 DIAGNOSIS — D225 Melanocytic nevi of trunk: Secondary | ICD-10-CM | POA: Diagnosis not present

## 2023-04-16 DIAGNOSIS — Z86018 Personal history of other benign neoplasm: Secondary | ICD-10-CM | POA: Diagnosis not present

## 2023-05-15 ENCOUNTER — Encounter: Payer: Self-pay | Admitting: Family Medicine

## 2023-05-15 ENCOUNTER — Ambulatory Visit (INDEPENDENT_AMBULATORY_CARE_PROVIDER_SITE_OTHER): Payer: BC Managed Care – PPO | Admitting: Family Medicine

## 2023-05-15 VITALS — BP 136/80 | HR 79 | Temp 98.2°F | Ht 74.0 in | Wt 215.8 lb

## 2023-05-15 DIAGNOSIS — Z1159 Encounter for screening for other viral diseases: Secondary | ICD-10-CM | POA: Diagnosis not present

## 2023-05-15 DIAGNOSIS — I10 Essential (primary) hypertension: Secondary | ICD-10-CM | POA: Diagnosis not present

## 2023-05-15 DIAGNOSIS — E782 Mixed hyperlipidemia: Secondary | ICD-10-CM | POA: Diagnosis not present

## 2023-05-15 LAB — LIPID PANEL
Cholesterol: 192 mg/dL (ref 0–200)
HDL: 105.3 mg/dL (ref >39.00)
LDL Cholesterol: 74 mg/dL (ref 0–99)
NonHDL: 86.74
Total CHOL/HDL Ratio: 2
Triglycerides: 65 mg/dL (ref 0.0–149.0)
VLDL: 13 mg/dL (ref 0.0–40.0)

## 2023-05-15 LAB — BASIC METABOLIC PANEL
BUN: 18 mg/dL (ref 6–23)
CO2: 24 mEq/L (ref 19–32)
Calcium: 9.4 mg/dL (ref 8.4–10.5)
Chloride: 102 mEq/L (ref 96–112)
Creatinine, Ser: 1.08 mg/dL (ref 0.40–1.50)
GFR: 76.16 mL/min (ref >60.00)
Glucose, Bld: 98 mg/dL (ref 70–99)
Potassium: 4.9 mEq/L (ref 3.5–5.1)
Sodium: 135 mEq/L (ref 135–145)

## 2023-05-15 MED ORDER — LOSARTAN POTASSIUM 50 MG PO TABS: 50.0000 mg | ORAL_TABLET | Freq: Every day | ORAL | 3 refills | Status: DC

## 2023-05-15 NOTE — Assessment & Plan Note (Signed)
Continue atorvastatin 40 mg daily. We will check annual lipids today.

## 2023-05-15 NOTE — Assessment & Plan Note (Signed)
Blood pressure is in good control. Continue losartan 50 mg daily. We will check annual renal labs today.

## 2023-05-15 NOTE — Progress Notes (Signed)
[facility] [address] [place] [address] Dept: [phone] Dept Fax: [phone]  Chronic Care Office Visit  Subjective:    Patient ID: Daniel Mercer, male    DOB: Nov 04, 1966, 57 y.o..   MRN: [id number]  Chief Complaint  Patient presents with   Medical Management of Chronic Issues    4 month f/u.  No concerns.  Fasting today.     History of Present Illness:  Patient is in today for reassessment of chronic medical issues.  Mr. Steffan has a history of hypertension. He is currently managed on losartan 50 mg daily. He has past intolerance of lisinopril and amlodipine. He is tolerating losartan well. He notes his BPs are doing a bit better at home than what we get in the office.   Mr. Bartold has a history of hyperlipidemia. He is currently managed on atorvastatin 40 mg daily.  Past Medical History: Patient Active Problem List   Diagnosis Date Noted   Lentigo 11/13/2021   Essential hypertension 01/09/2021   Acne rosacea 01/21/2011   Osteoarthritis 10/31/2008   Hemorrhoids 12/18/2007   Hyperlipidemia 12/17/2007   Past Surgical History:  Procedure Laterality Date   bilateral inguinal hernia repairs     left in 1997, right in 2003   Family History  Problem Relation Age of Onset   Colon polyps Mother    Colon polyps Father    Colon polyps Sister    Colon cancer Neg Hx    Esophageal cancer Neg Hx    Rectal cancer Neg Hx    Stomach cancer Neg Hx    Outpatient Medications Prior to Visit  Medication Sig Dispense Refill   atorvastatin (LIPITOR) 40 MG tablet TAKE 1 TABLET BY MOUTH EVERY DAY 90 tablet 3   Multiple Vitamin (MULTIVITAMIN WITH MINERALS) TABS tablet Take 1 tablet by mouth daily.     naproxen sodium (ANAPROX) 220 MG tablet Take 220 mg by mouth daily.     losartan (COZAAR) 50 MG tablet Take 1 tablet (50 mg total) by mouth daily. 90 tablet 3   No facility-administered medications prior to visit.    Allergies  Allergen Reactions   Amlodipine Swelling and Rash   Lisinopril Cough   Shellfish Allergy Rash   Objective:   Today's Vitals   05/15/23 0808  BP: 136/80  Pulse: 79  Temp: 98.2 F (36.8 C)  TempSrc: Temporal  SpO2: 99%  Weight: 215 lb 12.8 oz (97.9 kg)  Height: 6\' 2"  (1.88 m)   Body mass index is 27.71 kg/m.   General: Well developed, well nourished. No acute distress. Psych: Alert and oriented. Normal mood and affect.  Health Maintenance Due  Topic Date Due   HIV Screening  Never done   Hepatitis C Screening  Never done   Zoster Vaccines- Shingrix (1 of 2) Never done     Assessment & Plan:   Problem List Items Addressed This Visit       Cardiovascular and Mediastinum   Essential hypertension - Primary    Blood pressure is in good control. Continue losartan 50 mg daily. We will check annual renal labs today.      Relevant Medications   losartan (COZAAR) 50 MG tablet   Other Relevant Orders   Basic metabolic panel     Other   Hyperlipidemia    Continue atorvastatin 40 mg daily. We will check annual lipids today.      Relevant Medications   losartan (COZAAR) 50 MG  tablet   Other Relevant Orders   Lipid panel   Other Visit Diagnoses     Encounter for hepatitis C screening test for low risk patient       Relevant Orders   HCV Ab w Reflex to Quant PCR       Return in about 6 months (around 11/14/2023) for Reassessment.   Loyola Mast, MD

## 2023-05-16 LAB — HCV INTERPRETATION

## 2023-05-16 LAB — HCV AB W REFLEX TO QUANT PCR: HCV Ab: NONREACTIVE

## 2023-10-29 ENCOUNTER — Other Ambulatory Visit: Payer: Self-pay | Admitting: Family Medicine

## 2023-10-29 DIAGNOSIS — E782 Mixed hyperlipidemia: Secondary | ICD-10-CM

## 2023-11-18 ENCOUNTER — Encounter: Payer: Self-pay | Admitting: Family Medicine

## 2023-11-18 ENCOUNTER — Ambulatory Visit (INDEPENDENT_AMBULATORY_CARE_PROVIDER_SITE_OTHER): Payer: BC Managed Care – PPO | Admitting: Family Medicine

## 2023-11-18 VITALS — BP 138/90 | HR 90 | Temp 98.9°F | Ht 74.0 in | Wt 222.4 lb

## 2023-11-18 DIAGNOSIS — E782 Mixed hyperlipidemia: Secondary | ICD-10-CM

## 2023-11-18 DIAGNOSIS — Z860100 Personal history of colon polyps, unspecified: Secondary | ICD-10-CM | POA: Insufficient documentation

## 2023-11-18 DIAGNOSIS — I10 Essential (primary) hypertension: Secondary | ICD-10-CM | POA: Diagnosis not present

## 2023-11-18 DIAGNOSIS — Z1211 Encounter for screening for malignant neoplasm of colon: Secondary | ICD-10-CM

## 2023-11-18 MED ORDER — LOSARTAN POTASSIUM 100 MG PO TABS: 100.0000 mg | ORAL_TABLET | Freq: Every day | ORAL | 3 refills | Status: DC

## 2023-11-18 NOTE — Assessment & Plan Note (Signed)
At goal. Continue atorvastatin 40 mg daily.

## 2023-11-18 NOTE — Assessment & Plan Note (Signed)
Blood pressure is persistently in a stage 1 hypertension range. I recommend we increase his losartan to 100 mg daily.

## 2023-11-18 NOTE — Progress Notes (Signed)
[facility] [address] [place] [address] Dept: [phone] Dept Fax: [phone]  Chronic Care Office Visit  Subjective:    Patient ID: Daniel Mercer, male    DOB: 10-Aug-1966, 57 y.o..   MRN: [id number]  Chief Complaint  Patient presents with   Hypertension    6 month f/u HTN.  No concerns.   Fasting today.    History of Present Illness:  Patient is in today for reassessment of chronic medical issues.  Mr. Salmela has a history of hypertension. He is currently managed on losartan 50 mg daily. He has past intolerance of lisinopril and amlodipine. He is tolerating losartan well. He notes his BPs are mildly high most of the time at home.   Mr. Dworaczyk has a history of hyperlipidemia. He is currently managed on atorvastatin 40 mg daily.  Past Medical History: Patient Active Problem List   Diagnosis Date Noted   History of colon polyps 11/18/2023   Lentigo 11/13/2021   Essential hypertension 01/09/2021   Acne rosacea 01/21/2011   Osteoarthritis 10/31/2008   Hemorrhoids 12/18/2007   Hyperlipidemia 12/17/2007   Past Surgical History:  Procedure Laterality Date   bilateral inguinal hernia repairs     left in 1997, right in 2003   Family History  Problem Relation Age of Onset   Colon polyps Mother    Colon polyps Father    Colon polyps Sister    Colon cancer Neg Hx    Esophageal cancer Neg Hx    Rectal cancer Neg Hx    Stomach cancer Neg Hx    Outpatient Medications Prior to Visit  Medication Sig Dispense Refill   atorvastatin (LIPITOR) 40 MG tablet TAKE 1 TABLET BY MOUTH EVERY DAY 90 tablet 3   Multiple Vitamin (MULTIVITAMIN WITH MINERALS) TABS tablet Take 1 tablet by mouth daily.     naproxen sodium (ANAPROX) 220 MG tablet Take 220 mg by mouth daily.     losartan (COZAAR) 50 MG tablet Take 1 tablet (50 mg total) by mouth daily. 90 tablet 3   No facility-administered medications prior to visit.   Allergies   Allergen Reactions   Amlodipine Swelling and Rash   Lisinopril Cough   Shellfish Allergy Rash   Objective:   Today's Vitals   11/18/23 0810  BP: (!) 138/90  Pulse: 90  Temp: 98.9 F (37.2 C)  TempSrc: Temporal  SpO2: 97%  Weight: 222 lb 6.4 oz (100.9 kg)  Height: 6\' 2"  (1.88 m)   Body mass index is 28.55 kg/m.   General: Well developed, well nourished. No acute distress. Psych: Alert and oriented. Normal mood and affect.  Health Maintenance Due  Topic Date Due   HIV Screening  Never done   Colonoscopy  12/29/2023     Assessment & Plan:   Problem List Items Addressed This Visit       Cardiovascular and Mediastinum   Essential hypertension - Primary   Blood pressure is persistently in a stage 1 hypertension range. I recommend we increase his losartan to 100 mg daily.       Relevant Medications   losartan (COZAAR) 100 MG tablet     Other   Hyperlipidemia   At goal. Continue atorvastatin 40 mg daily.      Relevant Medications   losartan (COZAAR) 100 MG tablet   Other Visit Diagnoses       Screening for colon cancer       Last colonsocopy in 2020.  Had a tubular adenoma. Due for repeat colonoscopy in 12/2023.   Relevant Orders   Ambulatory referral to Gastroenterology       Return in about 3 months (around 02/16/2024) for Reassessment.   Loyola Mast, MD

## 2024-01-05 ENCOUNTER — Encounter: Payer: Self-pay | Admitting: Family Medicine

## 2024-02-17 ENCOUNTER — Ambulatory Visit (AMBULATORY_SURGERY_CENTER): Payer: BC Managed Care – PPO | Admitting: *Deleted

## 2024-02-17 ENCOUNTER — Ambulatory Visit (INDEPENDENT_AMBULATORY_CARE_PROVIDER_SITE_OTHER): Payer: BC Managed Care – PPO | Admitting: Family Medicine

## 2024-02-17 ENCOUNTER — Telehealth: Payer: Self-pay | Admitting: *Deleted

## 2024-02-17 ENCOUNTER — Encounter: Payer: Self-pay | Admitting: Family Medicine

## 2024-02-17 VITALS — BP 142/90 | HR 84 | Temp 98.9°F | Ht 74.0 in | Wt 223.8 lb

## 2024-02-17 VITALS — Ht 74.0 in | Wt 226.0 lb

## 2024-02-17 DIAGNOSIS — Z860100 Personal history of colon polyps, unspecified: Secondary | ICD-10-CM

## 2024-02-17 DIAGNOSIS — I10 Essential (primary) hypertension: Secondary | ICD-10-CM | POA: Diagnosis not present

## 2024-02-17 DIAGNOSIS — L570 Actinic keratosis: Secondary | ICD-10-CM | POA: Diagnosis not present

## 2024-02-17 DIAGNOSIS — E782 Mixed hyperlipidemia: Secondary | ICD-10-CM | POA: Diagnosis not present

## 2024-02-17 MED ORDER — SUFLAVE 178.7 G PO SOLR: 1.0000 | Freq: Once | ORAL | 0 refills | Status: AC

## 2024-02-17 MED ORDER — HYDROCHLOROTHIAZIDE 25 MG PO TABS: 25.0000 mg | ORAL_TABLET | Freq: Every day | ORAL | 3 refills | Status: AC

## 2024-02-17 NOTE — Telephone Encounter (Signed)
 Attempt to reach pt for pre-visit. LM with call back # Will attempt to reach again in 5 min due to no other # listed in profile    Second attempt  reached  pt

## 2024-02-17 NOTE — Assessment & Plan Note (Signed)
 Blood pressure remains above goal. Continue losartan 100 mg daily. Although Daniel Mercer has wanted to avoid diuretics, I feel we must try this as an option. I will start him on HCTZ 25 mg daily. I will reassess him in 1 month.

## 2024-02-17 NOTE — Progress Notes (Signed)
 [facility] [address] [place] [address] Dept: [phone] Dept Fax: [phone]  Chronic Care Office Visit  Subjective:    Patient ID: Daniel Mercer, male    DOB: 05/08/1966, 58 y.o..   MRN: [id number]  Chief Complaint  Patient presents with   Hypertension    3 month f/u HTN.  Not Fasting today.  BP average 140/80 - 150-90 at home.     History of Present Illness:  Patient is in today for reassessment of chronic medical issues.  Mr. Valent has a history of hypertension. He is currently managed on losartan 100 mg daily. This was increased at his last visit. He has past intolerance of lisinopril and amlodipine. He is seeing home BPs of 140/80-150/90.   Mr. Steeves has a history of hyperlipidemia. He is currently managed on atorvastatin 40 mg daily.  Mr. Mineer notes an area of scabbing on his right index finger. He says this never seems to heal.  Past Medical History: Patient Active Problem List   Diagnosis Date Noted   History of colon polyps 11/18/2023   Lentigo 11/13/2021   Essential hypertension 01/09/2021   Acne rosacea 01/21/2011   Osteoarthritis 10/31/2008   Hemorrhoids 12/18/2007   Hyperlipidemia 12/17/2007   Past Surgical History:  Procedure Laterality Date   bilateral inguinal hernia repairs     left in 1997, right in 2003   Family History  Problem Relation Age of Onset   Colon polyps Mother    Colon polyps Father    Colon polyps Sister    Colon cancer Neg Hx    Esophageal cancer Neg Hx    Rectal cancer Neg Hx    Stomach cancer Neg Hx    Outpatient Medications Prior to Visit  Medication Sig Dispense Refill   atorvastatin (LIPITOR) 40 MG tablet TAKE 1 TABLET BY MOUTH EVERY DAY 90 tablet 3   losartan (COZAAR) 100 MG tablet Take 1 tablet (100 mg total) by mouth daily. 90 tablet 3   Multiple Vitamin (MULTIVITAMIN WITH MINERALS) TABS tablet Take 1 tablet by mouth daily.     naproxen sodium (ANAPROX)  220 MG tablet Take 220 mg by mouth daily.     No facility-administered medications prior to visit.   Allergies  Allergen Reactions   Amlodipine Swelling and Rash   Lisinopril Cough   Shellfish Allergy Rash   Objective:   Today's Vitals   02/17/24 0805  BP: (!) 144/90  Pulse: 84  Temp: 98.9 F (37.2 C)  TempSrc: Temporal  SpO2: 97%  Weight: 223 lb 12.8 oz (101.5 kg)  Height: 6\' 2"  (1.88 m)   Body mass index is 28.73 kg/m.   General: Well developed, well nourished. No acute distress. Skin: There is a 2-4 mm area of scaliness over a shallow bed on the right 2nd finger. Psych: Alert and oriented. Normal mood and affect.  Health Maintenance Due  Topic Date Due   HIV Screening  Never done   Zoster Vaccines- Shingrix (1 of 2) Never done   Colonoscopy  12/29/2023     Assessment & Plan:   Problem List Items Addressed This Visit       Cardiovascular and Mediastinum   Essential hypertension - Primary   Blood pressure remains above goal. Continue losartan 100 mg daily. Although Mr. Lizarraga has wanted to avoid diuretics, I feel we must try this as an option. I will start him on HCTZ 25 mg daily. I will reassess him in  1 month.      Relevant Medications   hydrochlorothiazide (HYDRODIURIL) 25 MG tablet     Other   Hyperlipidemia   At goal. Continue atorvastatin 40 mg daily.      Relevant Medications   hydrochlorothiazide (HYDRODIURIL) 25 MG tablet   Other Visit Diagnoses       Actinic keratosis       Likely an actinic keratosis. Has an appointment with dermatology soon. Likely should treat with cryotherapy.       Return in about 4 weeks (around 03/16/2024) for Reassessment.   Loyola Mast, MD

## 2024-02-17 NOTE — Assessment & Plan Note (Signed)
At goal. Continue atorvastatin 40 mg daily.

## 2024-02-17 NOTE — Progress Notes (Addendum)
 Pt's name and DOB verified at the beginning of the pre-visit wit 2 identifiers  Pt denies any difficulty with ambulating,sitting, laying down or rolling side to side  Pt has no issues with ambulation   Pt has no issues moving head neck or swallowing  No egg or soy allergy known to patient   No issues known to pt with past sedation with any surgeries or procedures  Pt denies having issues being intubated  No FH of Malignant Hyperthermia  Pt is not on diet pills or shots  Pt is not on home 02   Pt is not on blood thinners   Pt denies issues with constipation   Pt has frequent issues with constipation RN instructed pt to use Miralax per bottles instructions a week before prep days. Pt states they will  Pt is not on dialysis  Pt denise any abnormal heart rhythms   Pt denies any upcoming cardiac testing  Chart reviewed by CRNA prior to PV  Visit by phone  Pt states weight is 226 lb  IInstructions reviewed. Pt given [facility] main # and MD on call # prior to instructions.  Pt states understanding of instructions. Instructed to review again prior to procedure. Pt states they will.   Informed pt that they will receive a text or  call from [facility] regarding there prep med.

## 2024-02-26 ENCOUNTER — Encounter: Payer: Self-pay | Admitting: Internal Medicine

## 2024-03-02 ENCOUNTER — Ambulatory Visit (AMBULATORY_SURGERY_CENTER): Payer: BC Managed Care – PPO | Admitting: Internal Medicine

## 2024-03-02 ENCOUNTER — Encounter: Payer: Self-pay | Admitting: Internal Medicine

## 2024-03-02 VITALS — BP 135/72 | HR 80 | Temp 99.1°F | Resp 11 | Ht 74.0 in | Wt 226.0 lb

## 2024-03-02 DIAGNOSIS — K5730 Diverticulosis of large intestine without perforation or abscess without bleeding: Secondary | ICD-10-CM

## 2024-03-02 DIAGNOSIS — D122 Benign neoplasm of ascending colon: Secondary | ICD-10-CM | POA: Diagnosis not present

## 2024-03-02 DIAGNOSIS — K648 Other hemorrhoids: Secondary | ICD-10-CM

## 2024-03-02 DIAGNOSIS — Z1211 Encounter for screening for malignant neoplasm of colon: Secondary | ICD-10-CM | POA: Diagnosis not present

## 2024-03-02 DIAGNOSIS — Z860100 Personal history of colon polyps, unspecified: Secondary | ICD-10-CM

## 2024-03-02 MED ORDER — SODIUM CHLORIDE 0.9 % IV SOLN
500.0000 mL | Freq: Once | INTRAVENOUS | Status: DC
Start: 2024-03-02 — End: 2024-03-02

## 2024-03-02 NOTE — Op Note (Signed)
 [facility] Patient Name: Daniel Mercer Procedure Date: 03/02/2024 8:35 AM MRN: [id number] Endoscopist: Wilhemina Bonito. Marina Goodell , MD, [id number] Age: 58 Referring MD:  Date of Birth: 02-Sep-1966 Gender: Male Account #: [id number] Procedure:                Colonoscopy with cold snare polypectomy x 1 Indications:              High risk colon cancer surveillance: Personal                            history of non-advanced adenomas. Previous                            examination January 2020 Medicines:                Monitored Anesthesia Care Procedure:                Pre-Anesthesia Assessment:                           - Prior to the procedure, a History and Physical                            was performed, and patient medications and                            allergies were reviewed. The patient's tolerance of                            previous anesthesia was also reviewed. The risks                            and benefits of the procedure and the sedation                            options and risks were discussed with the patient.                            All questions were answered, and informed consent                            was obtained. Prior Anticoagulants: The patient has                            taken no anticoagulant or antiplatelet agents. ASA                            Grade Assessment: II - A patient with mild systemic                            disease. After reviewing the risks and benefits,                            the patient was deemed in satisfactory condition to  undergo the procedure.                           After obtaining informed consent, the colonoscope                            was passed under direct vision. Throughout the                            procedure, the patient's blood pressure, pulse, and                            oxygen saturations were monitored continuously. The                            CF [id number]  #[id number] was introduced through the anus                            and advanced to the the cecum, identified by                            appendiceal orifice and ileocecal valve. The                            ileocecal valve, appendiceal orifice, and rectum                            were photographed. The quality of the bowel                            preparation was excellent. The colonoscopy was                            performed without difficulty. The patient tolerated                            the procedure well. The bowel preparation used was                            SUPREP via split dose instruction. Scope In: 8:55:21 AM Scope Out: 9:06:58 AM Scope Withdrawal Time: 0 hours 10 minutes 12 seconds  Total Procedure Duration: 0 hours 11 minutes 37 seconds  Findings:                 A 5 mm polyp was found in the ascending colon. The                            polyp was removed with a cold snare. Resection and                            retrieval were complete.                           Many diverticula were found in the entire colon.  Internal hemorrhoids were found during                            retroflexion. The hemorrhoids were moderate.                           The exam was otherwise without abnormality on                            direct and retroflexion views. Complications:            No immediate complications. Estimated blood loss:                            None. Estimated Blood Loss:     Estimated blood loss: none. Impression:               - One 5 mm polyp in the ascending colon, removed                            with a cold snare. Resected and retrieved.                           - Diverticulosis in the entire examined colon.                           - Internal hemorrhoids.                           - The examination was otherwise normal on direct                            and retroflexion views. Recommendation:           - Repeat  colonoscopy in 5 years for surveillance.                           - Patient has a contact number available for                            emergencies. The signs and symptoms of potential                            delayed complications were discussed with the                            patient. Return to normal activities tomorrow.                            Written discharge instructions were provided to the                            patient.                           - Resume previous diet.                           -  Continue present medications.                           - Await pathology results. Wilhemina Bonito. Marina Goodell, MD 03/02/2024 9:12:54 AM This report has been signed electronically.

## 2024-03-02 NOTE — Progress Notes (Signed)
 HISTORY OF PRESENT ILLNESS:  Daniel Mercer is a 58 y.o. male with a history of adenomatous colon polyp.  Presents today for surveillance colonoscopy  REVIEW OF SYSTEMS:  All non-GI ROS negative except for  Past Medical History:  Diagnosis Date   Acne rosacea    DJD (degenerative joint disease)    Hemorrhoids    Hyperlipidemia    Hypertension    Personal history of allergy to seafood    Sinusitis     Past Surgical History:  Procedure Laterality Date   bilateral inguinal hernia repairs     left in 1997, right in 2003   bone spur removal Left    hand   COLONOSCOPY      Social History Daniel Mercer  reports that he has never smoked. He has quit using smokeless tobacco.  His smokeless tobacco use included snuff. He reports current alcohol use of about 13.0 standard drinks of alcohol per week. He reports that he does not use drugs.  family history includes Colon polyps in his father, mother, and sister.  Allergies  Allergen Reactions   Amlodipine Swelling and Rash   Lisinopril Cough   Shellfish Allergy Rash       PHYSICAL EXAMINATION: Vital signs: BP 135/69   Pulse 95   Temp 99.1 F (37.3 C) (Temporal)   Ht 6\' 2"  (1.88 m)   Wt 226 lb (102.5 kg)   SpO2 98%   BMI 29.02 kg/m  General: Well-developed, well-nourished, no acute distress HEENT: Sclerae are anicteric, conjunctiva pink. Oral mucosa intact Lungs: Clear Heart: Regular Abdomen: soft, nontender, nondistended, no obvious ascites, no peritoneal signs, normal bowel sounds. No organomegaly. Extremities: No edema Psychiatric: alert and oriented x3. Cooperative     ASSESSMENT:   History of adenomatous polyps  PLAN: Surveillance colonoscopy

## 2024-03-02 NOTE — Progress Notes (Signed)
 Pt's states no medical or surgical changes since previsit or office visit.

## 2024-03-02 NOTE — Patient Instructions (Signed)
 Resume previous diet and medications.  Handouts provided on polyps, diverticulosis, and hemorrhoids.  Repeat colonoscopy in 5 years.    YOU HAD AN ENDOSCOPIC PROCEDURE TODAY AT THE [facility]:   Refer to the procedure report that was given to you for any specific questions about what was found during the examination.  If the procedure report does not answer your questions, please call your gastroenterologist to clarify.  If you requested that your care partner not be given the details of your procedure findings, then the procedure report has been included in a sealed envelope for you to review at your convenience later.  YOU SHOULD EXPECT: Some feelings of bloating in the abdomen. Passage of more gas than usual.  Walking can help get rid of the air that was put into your GI tract during the procedure and reduce the bloating. If you had a lower endoscopy (such as a colonoscopy or flexible sigmoidoscopy) you may notice spotting of blood in your stool or on the toilet paper. If you underwent a bowel prep for your procedure, you may not have a normal bowel movement for a few days.  Please Note:  You might notice some irritation and congestion in your nose or some drainage.  This is from the oxygen used during your procedure.  There is no need for concern and it should clear up in a day or so.  SYMPTOMS TO REPORT IMMEDIATELY:  Following lower endoscopy (colonoscopy or flexible sigmoidoscopy):  Excessive amounts of blood in the stool  Significant tenderness or worsening of abdominal pains  Swelling of the abdomen that is new, acute  Fever of 100F or higher  For urgent or emergent issues, a gastroenterologist can be reached at any hour by calling [phone]. Do not use MyChart messaging for urgent concerns.    DIET:  We do recommend a small meal at first, but then you may proceed to your regular diet.  Drink plenty of fluids but you should avoid alcoholic beverages for 24  hours.  ACTIVITY:  You should plan to take it easy for the rest of today and you should NOT DRIVE or use heavy machinery until tomorrow (because of the sedation medicines used during the test).    FOLLOW UP: Our staff will call the number listed on your records the next business day following your procedure.  We will call around 7:15- 8:00 am to check on you and address any questions or concerns that you may have regarding the information given to you following your procedure. If we do not reach you, we will leave a message.     If any biopsies were taken you will be contacted by phone or by letter within the next 1-3 weeks.  Please call us at [phone] if you have not heard about the biopsies in 3 weeks.    SIGNATURES/CONFIDENTIALITY: You and/or your care partner have signed paperwork which will be entered into your electronic medical record.  These signatures attest to the fact that that the information above on your After Visit Summary has been reviewed and is understood.  Full responsibility of the confidentiality of this discharge information lies with you and/or your care-partner.

## 2024-03-02 NOTE — Progress Notes (Signed)
 Drowsy, VSS, resps reg and even. Report to RN

## 2024-03-02 NOTE — Progress Notes (Signed)
 Called to room to assist during endoscopic procedure.  Patient ID and intended procedure confirmed with present staff. Received instructions for my participation in the procedure from the performing physician.

## 2024-03-03 ENCOUNTER — Telehealth: Payer: Self-pay

## 2024-03-03 NOTE — Telephone Encounter (Signed)
  Follow up Call-     03/02/2024    7:32 AM  Call back number  Post procedure Call Back phone  # [phone]  Permission to leave phone message Yes     Patient questions:  Do you have a fever, pain , or abdominal swelling? No. Pain Score  0 *  Have you tolerated food without any problems? Yes.    Have you been able to return to your normal activities? Yes.    Do you have any questions about your discharge instructions: Diet   No. Medications  No. Follow up visit  No.  Do you have questions or concerns about your Care? No.  Actions: * If pain score is 4 or above: No action needed, pain <4.

## 2024-03-04 ENCOUNTER — Encounter: Payer: Self-pay | Admitting: Internal Medicine

## 2024-03-04 LAB — SURGICAL PATHOLOGY

## 2024-03-16 ENCOUNTER — Ambulatory Visit (INDEPENDENT_AMBULATORY_CARE_PROVIDER_SITE_OTHER): Admitting: Family Medicine

## 2024-03-16 ENCOUNTER — Encounter: Payer: Self-pay | Admitting: Family Medicine

## 2024-03-16 VITALS — BP 124/72 | HR 87 | Temp 98.1°F | Ht 74.0 in | Wt 221.2 lb

## 2024-03-16 DIAGNOSIS — I10 Essential (primary) hypertension: Secondary | ICD-10-CM | POA: Diagnosis not present

## 2024-03-16 NOTE — Assessment & Plan Note (Signed)
 Blood pressure now at goal. Continue losartan 100 mg daily and HCTZ 25 mg daily.

## 2024-03-16 NOTE — Progress Notes (Signed)
  [facility]ARE [facility] [address] [place] [address] Dept: [phone] Dept Fax: [phone]  Chronic Care Office Visit  Subjective:    Patient ID: Daniel Mercer, male    DOB: 03-Dec-1965, 58 y.o..   MRN: [id number]  Chief Complaint  Patient presents with   Hypertension    4 week f/u HTN.  No concerns.  BP @ home 132/88, 118/74, 128/86, 118/76   History of Present Illness:  Patient is in today for reassessment of chronic medical issues.  Mr. Robling has a history of hypertension. At his last 2 visits, His BP had been elevated. He has past intolerance of lisinopril and amlodipine. He was seeing home BPs of 140/80-150/90. I added HCTZ 25 mg to his losartan 100 mg daily. He is taking this without difficulty. He notes his BP is improved. He is seeing home BPs of 118-132/74-88.  Past Medical History: Patient Active Problem List   Diagnosis Date Noted   History of colon polyps 11/18/2023   Lentigo 11/13/2021   Essential hypertension 01/09/2021   Acne rosacea 01/21/2011   Osteoarthritis 10/31/2008   Hemorrhoids 12/18/2007   Hyperlipidemia 12/17/2007   Past Surgical History:  Procedure Laterality Date   bilateral inguinal hernia repairs     left in 1997, right in 2003   bone spur removal Left    hand   COLONOSCOPY     Family History  Problem Relation Age of Onset   Colon polyps Mother    Colon polyps Father    Colon polyps Sister    Colon cancer Neg Hx    Esophageal cancer Neg Hx    Rectal cancer Neg Hx    Stomach cancer Neg Hx    Outpatient Medications Prior to Visit  Medication Sig Dispense Refill   atorvastatin (LIPITOR) 40 MG tablet TAKE 1 TABLET BY MOUTH EVERY DAY 90 tablet 3   hydrochlorothiazide (HYDRODIURIL) 25 MG tablet Take 1 tablet (25 mg total) by mouth daily. 90 tablet 3   losartan (COZAAR) 100 MG tablet Take 1 tablet (100 mg total) by mouth daily. 90 tablet 3   Multiple Vitamin (MULTIVITAMIN WITH MINERALS)  TABS tablet Take 1 tablet by mouth daily.     naproxen sodium (ANAPROX) 220 MG tablet Take 220 mg by mouth daily.     No facility-administered medications prior to visit.   Allergies  Allergen Reactions   Amlodipine Swelling and Rash   Lisinopril Cough   Shellfish Allergy Rash   Objective:   Today's Vitals   03/16/24 1442  BP: 124/72  Pulse: 87  Temp: 98.1 F (36.7 C)  TempSrc: Temporal  SpO2: 98%  Weight: 221 lb 3.2 oz (100.3 kg)  Height: 6\' 2"  (1.88 m)   Body mass index is 28.4 kg/m.   General: Well developed, well nourished. No acute distress. Psych: Alert and oriented. Normal mood and affect.  Health Maintenance Due  Topic Date Due   HIV Screening  Never done   Zoster Vaccines- Shingrix (1 of 2) Never done     Assessment & Plan:   Problem List Items Addressed This Visit       Cardiovascular and Mediastinum   Essential hypertension - Primary   Blood pressure now at goal. Continue losartan 100 mg daily and HCTZ 25 mg daily.       Return in about 4 months (around 07/16/2024) for Reassessment.   Loyola Mast, MD

## 2024-05-10 DIAGNOSIS — L821 Other seborrheic keratosis: Secondary | ICD-10-CM | POA: Diagnosis not present

## 2024-05-10 DIAGNOSIS — L570 Actinic keratosis: Secondary | ICD-10-CM | POA: Diagnosis not present

## 2024-05-10 DIAGNOSIS — Z86018 Personal history of other benign neoplasm: Secondary | ICD-10-CM | POA: Diagnosis not present

## 2024-05-10 DIAGNOSIS — L814 Other melanin hyperpigmentation: Secondary | ICD-10-CM | POA: Diagnosis not present

## 2024-05-10 DIAGNOSIS — D225 Melanocytic nevi of trunk: Secondary | ICD-10-CM | POA: Diagnosis not present

## 2024-07-13 ENCOUNTER — Ambulatory Visit (INDEPENDENT_AMBULATORY_CARE_PROVIDER_SITE_OTHER): Admitting: Family Medicine

## 2024-07-13 ENCOUNTER — Ambulatory Visit: Payer: Self-pay | Admitting: Family Medicine

## 2024-07-13 ENCOUNTER — Encounter: Payer: Self-pay | Admitting: Family Medicine

## 2024-07-13 VITALS — BP 134/76 | HR 77 | Temp 97.4°F | Ht 74.0 in | Wt 220.6 lb

## 2024-07-13 DIAGNOSIS — I10 Essential (primary) hypertension: Secondary | ICD-10-CM | POA: Diagnosis not present

## 2024-07-13 DIAGNOSIS — R7401 Elevation of levels of liver transaminase levels: Secondary | ICD-10-CM

## 2024-07-13 DIAGNOSIS — E782 Mixed hyperlipidemia: Secondary | ICD-10-CM

## 2024-07-13 DIAGNOSIS — D225 Melanocytic nevi of trunk: Secondary | ICD-10-CM | POA: Insufficient documentation

## 2024-07-13 LAB — COMPREHENSIVE METABOLIC PANEL WITH GFR
ALT: 56 U/L — ABNORMAL HIGH (ref 0–53)
AST: 61 U/L — ABNORMAL HIGH (ref 0–37)
Albumin: 4.3 g/dL (ref 3.5–5.2)
Alkaline Phosphatase: 76 U/L (ref 39–117)
BUN: 32 mg/dL — ABNORMAL HIGH (ref 6–23)
CO2: 23 meq/L (ref 19–32)
Calcium: 9.4 mg/dL (ref 8.4–10.5)
Chloride: 99 meq/L (ref 96–112)
Creatinine, Ser: 1.25 mg/dL (ref 0.40–1.50)
GFR: 63.39 mL/min (ref >60.00)
Glucose, Bld: 86 mg/dL (ref 70–99)
Potassium: 4.4 meq/L (ref 3.5–5.1)
Sodium: 133 meq/L — ABNORMAL LOW (ref 135–145)
Total Bilirubin: 0.7 mg/dL (ref 0.2–1.2)
Total Protein: 8.1 g/dL (ref 6.0–8.3)

## 2024-07-13 LAB — LIPID PANEL
Cholesterol: 197 mg/dL (ref 0–200)
HDL: 92.7 mg/dL (ref >39.00)
LDL Cholesterol: 90 mg/dL (ref 0–99)
NonHDL: 104.2
Total CHOL/HDL Ratio: 2
Triglycerides: 73 mg/dL (ref 0.0–149.0)
VLDL: 14.6 mg/dL (ref 0.0–40.0)

## 2024-07-13 NOTE — Assessment & Plan Note (Signed)
 Blood pressure is acceptable. Recommend he resume regular exercise. He should try and lower his alcohol  intake. Continue losartan  100 mg daily and HCTZ 25 mg daily. I will check annual renal labs today.

## 2024-07-13 NOTE — Progress Notes (Signed)
 [facility]ARE [facility] [address] [place] [address] Dept: [phone] Dept Fax: [phone]  Chronic Care Office Visit  Subjective:    Patient ID: Daniel Mercer, male    DOB: 19-Dec-1965, 58 y.o..   MRN: [id number]  Chief Complaint  Patient presents with   Hypertension    4 month f/u HTN.  No concerns.  Fasting today.    History of Present Illness:  Patient is in today for reassessment of chronic medical issues.  Daniel Mercer has a history of hypertension. He is managed on losartan  100 mg daily and HCTZ 25 mg daily. He admits he has not been exercising as much as before. He drinks a beer with dinner and usually has two liquor drinks later in the evening. His caffeine intake is low and he no longer uses dip tobacco.  Daniel Mercer has a history of hyperlipidemia. He is managed on atorvastatin  40 mg daily.  Past Medical History: Patient Active Problem List   Diagnosis Date Noted   Melanocytic nevus of trunk 07/13/2024   History of colon polyps 11/18/2023   Lentigo 11/13/2021   Essential hypertension 01/09/2021   Acne rosacea 01/21/2011   Osteoarthritis 10/31/2008   Hemorrhoids 12/18/2007   Hyperlipidemia 12/17/2007   Past Surgical History:  Procedure Laterality Date   bilateral inguinal hernia repairs     left in 1997, right in 2003   bone spur removal Left    hand   COLONOSCOPY     Family History  Problem Relation Age of Onset   Colon polyps Mother    Colon polyps Father    Colon polyps Sister    Colon cancer Neg Hx    Esophageal cancer Neg Hx    Rectal cancer Neg Hx    Stomach cancer Neg Hx    Outpatient Medications Prior to Visit  Medication Sig Dispense Refill   atorvastatin  (LIPITOR) 40 MG tablet TAKE 1 TABLET BY MOUTH EVERY DAY 90 tablet 3   hydrochlorothiazide  (HYDRODIURIL ) 25 MG tablet Take 1 tablet (25 mg total) by mouth daily. 90 tablet 3   losartan  (COZAAR ) 100 MG tablet Take 1 tablet (100 mg total) by  mouth daily. 90 tablet 3   Multiple Vitamin (MULTIVITAMIN WITH MINERALS) TABS tablet Take 1 tablet by mouth daily.     naproxen sodium (ANAPROX) 220 MG tablet Take 220 mg by mouth daily.     No facility-administered medications prior to visit.   Allergies  Allergen Reactions   Amlodipine  Swelling and Rash   Lisinopril  Cough   Shellfish Allergy Rash   Objective:   Today's Vitals   07/13/24 0757  BP: 134/76  Pulse: 77  Temp: (!) 97.4 F (36.3 C)  TempSrc: Temporal  SpO2: 99%  Weight: 220 lb 9.6 oz (100.1 kg)  Height: 6' 2 (1.88 m)   Body mass index is 28.32 kg/m.   General: Well developed, well nourished. No acute distress. Psych: Alert and oriented. Normal mood and affect.  Health Maintenance Due  Topic Date Due   HIV Screening  Never done   Hepatitis B Vaccines (1 of 3 - 19+ 3-dose series) Never done   Zoster Vaccines- Shingrix (1 of 2) Never done   Pneumococcal Vaccine: 50+ Years (1 of 1 - PCV) Never done     Assessment & Plan:   Problem List Items Addressed This Visit       Cardiovascular and Mediastinum   Essential hypertension - Primary   Blood pressure is acceptable.  Recommend he resume regular exercise. He should try and lower his alcohol  intake. Continue losartan  100 mg daily and HCTZ 25 mg daily. I will check annual renal labs today.      Relevant Orders   Lipid panel     Other   Hyperlipidemia   I will check lipids and LFTs today.  Continue atorvastatin  40 mg daily.      Relevant Orders   Lipid panel   Comprehensive metabolic panel with GFR    Return in about 4 months (around 11/12/2024) for Reassessment.   Garnette CHRISTELLA Simpler, MD

## 2024-07-13 NOTE — Assessment & Plan Note (Signed)
 I will check lipids and LFTs today.  Continue atorvastatin  40 mg daily.

## 2024-11-06 ENCOUNTER — Other Ambulatory Visit: Payer: Self-pay | Admitting: Family Medicine

## 2024-11-06 ENCOUNTER — Other Ambulatory Visit: Payer: Self-pay | Admitting: Family

## 2024-11-06 DIAGNOSIS — E782 Mixed hyperlipidemia: Secondary | ICD-10-CM

## 2024-11-06 DIAGNOSIS — I10 Essential (primary) hypertension: Secondary | ICD-10-CM

## 2024-11-16 ENCOUNTER — Encounter: Payer: Self-pay | Admitting: Family Medicine

## 2024-11-16 ENCOUNTER — Ambulatory Visit: Admitting: Family Medicine

## 2024-11-16 VITALS — BP 134/78 | HR 82 | Temp 98.6°F | Ht 74.0 in | Wt 222.2 lb

## 2024-11-16 DIAGNOSIS — E782 Mixed hyperlipidemia: Secondary | ICD-10-CM | POA: Diagnosis not present

## 2024-11-16 DIAGNOSIS — I10 Essential (primary) hypertension: Secondary | ICD-10-CM | POA: Diagnosis not present

## 2024-11-16 NOTE — Assessment & Plan Note (Addendum)
 LDL cholesterol is at goal. Continue atorvastatin  40 mg daily.

## 2024-11-16 NOTE — Assessment & Plan Note (Signed)
 Blood pressure is acceptable. Continue losartan  100 mg daily and HCTZ 25 mg daily.

## 2024-11-16 NOTE — Progress Notes (Signed)
 [facility] [address] [place] [address] Dept: [phone] Dept Fax: [phone]  Chronic Care Office Visit  Subjective:    Patient ID: Daniel Mercer, male    DOB: 1966-03-30, 58 y.o..   MRN: [id number]  Chief Complaint  Patient presents with   Hypertension    4 month fu HTN.  No concerns.  Not fasting today    History of Present Illness:  Patient is in today for reassessment of chronic medical conditions.   Daniel Mercer has a history of hypertension. He is managed on losartan  100 mg daily and HCTZ 25 mg daily.    Daniel Mercer has a history of hyperlipidemia. He is managed on atorvastatin  40 mg daily.  Past Medical History: Patient Active Problem List   Diagnosis Date Noted   Melanocytic nevus of trunk 07/13/2024   History of colon polyps 11/18/2023   Lentigo 11/13/2021   Essential hypertension 01/09/2021   Acne rosacea 01/21/2011   Osteoarthritis 10/31/2008   Hemorrhoids 12/18/2007   Hyperlipidemia 12/17/2007   Past Surgical History:  Procedure Laterality Date   bilateral inguinal hernia repairs     left in 1997, right in 2003   bone spur removal Left    hand   COLONOSCOPY     Family History  Problem Relation Age of Onset   Colon polyps Mother    Colon polyps Father    Colon polyps Sister    Colon cancer Neg Hx    Esophageal cancer Neg Hx    Rectal cancer Neg Hx    Stomach cancer Neg Hx    Outpatient Medications Prior to Visit  Medication Sig Dispense Refill   atorvastatin  (LIPITOR) 40 MG tablet TAKE 1 TABLET BY MOUTH EVERY DAY 90 tablet 3   hydrochlorothiazide  (HYDRODIURIL ) 25 MG tablet Take 1 tablet (25 mg total) by mouth daily. 90 tablet 3   losartan  (COZAAR ) 100 MG tablet TAKE 1 TABLET BY MOUTH EVERY DAY 90 tablet 3   Multiple Vitamin (MULTIVITAMIN WITH MINERALS) TABS tablet Take 1 tablet by mouth daily.     naproxen sodium (ANAPROX) 220 MG tablet Take 220 mg by mouth daily.     No  facility-administered medications prior to visit.   Allergies[1]   Objective:   Today's Vitals   11/16/24 0805  BP: 134/78  Pulse: 82  Temp: 98.6 F (37 C)  TempSrc: Temporal  SpO2: 98%  Weight: 222 lb 3.2 oz (100.8 kg)  Height: 6' 2 (1.88 m)   Body mass index is 28.53 kg/m.   General: Well developed, well nourished. No acute distress. Psych: Alert and oriented. Normal mood and affect.  Health Maintenance Due  Topic Date Due   HIV Screening  Never done   Hepatitis B Vaccines 19-59 Average Risk (1 of 3 - 19+ 3-dose series) Never done   Zoster Vaccines- Shingrix (1 of 2) Never done   Pneumococcal Vaccine: 50+ Years (1 of 1 - PCV) Never done   COVID-19 Vaccine (3 - Pfizer risk series) 02/15/2021   Influenza Vaccine  07/02/2024   Lab Results Comprehensive Metabolic Panel:  Lab Results  Component Value Date   NA 133 (L) 07/13/2024   K 4.4 07/13/2024   CL 99 07/13/2024   CO2 23 07/13/2024   GLUCOSE 86 07/13/2024   BUN 32 (H) 07/13/2024   CREATININE 1.25 07/13/2024   CALCIUM  9.4 07/13/2024   PROT 8.1 07/13/2024   ALBUMIN 4.3 07/13/2024   AST 61 (H) 07/13/2024  ALT 56 (H) 07/13/2024   ALKPHOS 76 07/13/2024   BILITOT 0.7 07/13/2024   GFR 63.39 07/13/2024   GFRNONAA 86.20 01/10/2010   Lipid Panel:  Lab Results  Component Value Date   CHOL 197 07/13/2024   HDL 92.70 07/13/2024   LDLCALC 90 07/13/2024   TRIG 73.0 07/13/2024     Assessment & Plan:   Problem List Items Addressed This Visit       Cardiovascular and Mediastinum   Essential hypertension   Blood pressure is acceptable. Continue losartan  100 mg daily and HCTZ 25 mg daily.         Other   Hyperlipidemia - Primary   LDL cholesterol is at goal. Continue atorvastatin  40 mg daily.       Return in about 4 months (around 03/17/2025) for Reassessment.   Garnette CHRISTELLA Simpler, MD  I,Emily Lagle,acting as a scribe for Garnette CHRISTELLA Simpler, MD.,have documented all relevant documentation on the behalf of  Garnette CHRISTELLA Simpler, MD.  I, Garnette CHRISTELLA Simpler, MD, have reviewed all documentation for this visit. The documentation on 11/16/2024 for the exam, diagnosis, procedures, and orders are all accurate and complete.     [1]  Allergies Allergen Reactions   Amlodipine  Swelling and Rash   Lisinopril  Cough   Shellfish Allergy Rash

## 2025-03-22 ENCOUNTER — Ambulatory Visit: Admitting: Family Medicine
